# Patient Record
Sex: Male | Born: 1980 | Race: Black or African American | Hispanic: No | Marital: Single | Smoking: Current every day smoker
Health system: Southern US, Community
[De-identification: ages and names within clinical notes are randomized; demographics above are authoritative.]

## PROBLEM LIST (undated history)

## (undated) DIAGNOSIS — J45909 Unspecified asthma, uncomplicated: Secondary | ICD-10-CM

## (undated) DIAGNOSIS — S0181XA Laceration without foreign body of other part of head, initial encounter: Secondary | ICD-10-CM

## (undated) DIAGNOSIS — S42409A Unspecified fracture of lower end of unspecified humerus, initial encounter for closed fracture: Secondary | ICD-10-CM

## (undated) DIAGNOSIS — R1310 Dysphagia, unspecified: Secondary | ICD-10-CM

## (undated) DIAGNOSIS — Z8781 Personal history of (healed) traumatic fracture: Secondary | ICD-10-CM

## (undated) DIAGNOSIS — R198 Other specified symptoms and signs involving the digestive system and abdomen: Secondary | ICD-10-CM

## (undated) HISTORY — PX: FRACTURE SURGERY: SHX138

---

## 2012-02-27 ENCOUNTER — Emergency Department (HOSPITAL_COMMUNITY)
Admission: EM | Admit: 2012-02-27 | Discharge: 2012-02-28 | Disposition: A | Discharge status: Home / Self Care | Payer: No Typology Code available for payment source | Attending: Emergency Medicine | Admitting: Emergency Medicine

## 2012-02-27 DIAGNOSIS — S0280XA Fracture of other specified skull and facial bones, unspecified side, initial encounter for closed fracture: Secondary | ICD-10-CM | POA: Insufficient documentation

## 2012-02-27 DIAGNOSIS — S0181XA Laceration without foreign body of other part of head, initial encounter: Secondary | ICD-10-CM

## 2012-02-27 DIAGNOSIS — S42309A Unspecified fracture of shaft of humerus, unspecified arm, initial encounter for closed fracture: Secondary | ICD-10-CM | POA: Insufficient documentation

## 2012-02-27 DIAGNOSIS — T148XXA Other injury of unspecified body region, initial encounter: Secondary | ICD-10-CM | POA: Insufficient documentation

## 2012-02-27 DIAGNOSIS — S42302A Unspecified fracture of shaft of humerus, left arm, initial encounter for closed fracture: Secondary | ICD-10-CM

## 2012-02-27 DIAGNOSIS — S0292XA Unspecified fracture of facial bones, initial encounter for closed fracture: Secondary | ICD-10-CM

## 2012-02-27 DIAGNOSIS — S0180XA Unspecified open wound of other part of head, initial encounter: Secondary | ICD-10-CM | POA: Insufficient documentation

## 2012-02-27 DIAGNOSIS — Y9389 Activity, other specified: Secondary | ICD-10-CM | POA: Insufficient documentation

## 2012-02-27 DIAGNOSIS — J45909 Unspecified asthma, uncomplicated: Secondary | ICD-10-CM | POA: Insufficient documentation

## 2012-02-27 DIAGNOSIS — S42409A Unspecified fracture of lower end of unspecified humerus, initial encounter for closed fracture: Secondary | ICD-10-CM

## 2012-02-27 DIAGNOSIS — T07XXXA Unspecified multiple injuries, initial encounter: Secondary | ICD-10-CM

## 2012-02-27 DIAGNOSIS — Y92410 Unspecified street and highway as the place of occurrence of the external cause: Secondary | ICD-10-CM | POA: Insufficient documentation

## 2012-02-27 DIAGNOSIS — Z8781 Personal history of (healed) traumatic fracture: Secondary | ICD-10-CM

## 2012-02-27 HISTORY — DX: Laceration without foreign body of other part of head, initial encounter: S01.81XA

## 2012-02-27 HISTORY — DX: Personal history of (healed) traumatic fracture: Z87.81

## 2012-02-27 HISTORY — DX: Unspecified asthma, uncomplicated: J45.909

## 2012-02-27 HISTORY — DX: Unspecified fracture of lower end of unspecified humerus, initial encounter for closed fracture: S42.409A

## 2012-02-28 ENCOUNTER — Emergency Department (HOSPITAL_COMMUNITY): Payer: Self-pay

## 2012-02-28 ENCOUNTER — Encounter (HOSPITAL_COMMUNITY): Payer: Self-pay | Admitting: Emergency Medicine

## 2012-02-28 LAB — POCT I-STAT, CHEM 8
Calcium, Ion: 1.11 mmol/L — ABNORMAL LOW (ref 1.12–1.23)
Creatinine, Ser: 1.3 mg/dL (ref 0.50–1.35)
Glucose, Bld: 130 mg/dL — ABNORMAL HIGH (ref 70–99)
HCT: 43 % (ref 39.0–52.0)
Hemoglobin: 14.6 g/dL (ref 13.0–17.0)
Potassium: 3.8 mEq/L (ref 3.5–5.1)
TCO2: 25 mmol/L (ref 0–100)

## 2012-02-28 LAB — SAMPLE TO BLOOD BANK

## 2012-02-28 LAB — CBC
Hemoglobin: 13.4 g/dL (ref 13.0–17.0)
MCH: 28.2 pg (ref 26.0–34.0)
MCV: 84.2 fL (ref 78.0–100.0)
RBC: 4.76 MIL/uL (ref 4.22–5.81)

## 2012-02-28 LAB — COMPREHENSIVE METABOLIC PANEL
AST: 36 U/L (ref 0–37)
Albumin: 3.9 g/dL (ref 3.5–5.2)
CO2: 23 mEq/L (ref 19–32)
Calcium: 8.9 mg/dL (ref 8.4–10.5)
Chloride: 103 mEq/L (ref 96–112)
Creatinine, Ser: 0.86 mg/dL (ref 0.50–1.35)
GFR calc non Af Amer: >90 mL/min (ref >90)
Glucose, Bld: 131 mg/dL — ABNORMAL HIGH (ref 70–99)
Potassium: 3.9 mEq/L (ref 3.5–5.1)
Sodium: 141 mEq/L (ref 135–145)
Total Protein: 7.5 g/dL (ref 6.0–8.3)

## 2012-02-28 LAB — PROTIME-INR: Prothrombin Time: 13.6 seconds (ref 11.6–15.2)

## 2012-02-28 MED ORDER — OXYCODONE-ACETAMINOPHEN 5-325 MG PO TABS
2.0000 | ORAL_TABLET | Freq: Once | ORAL | Status: AC
  Administered 2012-02-28: 2 via ORAL
  Filled 2012-02-28 (×2): qty 2

## 2012-02-28 MED ORDER — IOHEXOL 300 MG/ML  SOLN
100.0000 mL | Freq: Once | INTRAMUSCULAR | Status: AC | PRN
  Administered 2012-02-28: 100 mL via INTRAVENOUS

## 2012-02-28 MED ORDER — FENTANYL CITRATE 0.05 MG/ML IJ SOLN
50.0000 ug | INTRAMUSCULAR | Status: DC | PRN
  Administered 2012-02-28: 50 ug via INTRAVENOUS

## 2012-02-28 MED ORDER — LIDOCAINE-EPINEPHRINE 2 %-1:100000 IJ SOLN: 20.0000 mL | Freq: Once | INTRAMUSCULAR | Status: DC

## 2012-02-28 MED ORDER — OXYCODONE-ACETAMINOPHEN 5-325 MG PO TABS: 2.0000 | ORAL_TABLET | Freq: Four times a day (QID) | ORAL | Status: DC | PRN

## 2012-02-28 MED ORDER — FENTANYL CITRATE 0.05 MG/ML IJ SOLN
INTRAMUSCULAR | Status: AC
  Filled 2012-02-28: qty 2

## 2012-02-28 MED ORDER — HYDROMORPHONE HCL PF 1 MG/ML IJ SOLN
1.0000 mg | Freq: Once | INTRAMUSCULAR | Status: AC
  Administered 2012-02-28: 1 mg via INTRAVENOUS
  Filled 2012-02-28: qty 1

## 2012-02-28 MED ORDER — LIDOCAINE-EPINEPHRINE (PF) 2 %-1:200000 IJ SOLN
10.0000 mL | Freq: Once | INTRAMUSCULAR | Status: AC
  Administered 2012-02-28: 10 mL via INTRADERMAL
  Filled 2012-02-28: qty 10

## 2012-02-28 MED ORDER — CEPHALEXIN 500 MG PO CAPS: 500.0000 mg | ORAL_CAPSULE | Freq: Four times a day (QID) | ORAL | Status: DC

## 2012-02-28 MED ORDER — ONDANSETRON HCL 4 MG/2ML IJ SOLN
4.0000 mg | Freq: Once | INTRAMUSCULAR | Status: AC
Start: 2012-02-28 — End: 2012-02-28
  Administered 2012-02-28: 4 mg via INTRAVENOUS
  Filled 2012-02-28: qty 2

## 2012-02-28 MED ORDER — CEFAZOLIN SODIUM 1-5 GM-% IV SOLN
1.0000 g | Freq: Once | INTRAVENOUS | Status: AC
  Administered 2012-02-28: 1 g via INTRAVENOUS
  Filled 2012-02-28: qty 50

## 2012-02-28 MED ORDER — TETANUS-DIPHTH-ACELL PERTUSSIS 5-2.5-18.5 LF-MCG/0.5 IM SUSP
INTRAMUSCULAR | Status: AC
  Administered 2012-02-28: 0.5 mL via INTRAMUSCULAR
  Filled 2012-02-28: qty 0.5

## 2012-02-28 MED ORDER — IBUPROFEN 800 MG PO TABS: 800.0000 mg | ORAL_TABLET | Freq: Three times a day (TID) | ORAL | Status: DC

## 2012-02-28 MED ORDER — CYCLOBENZAPRINE HCL 10 MG PO TABS: 10.0000 mg | ORAL_TABLET | Freq: Two times a day (BID) | ORAL | Status: DC | PRN

## 2012-02-28 MED ORDER — LIDOCAINE-EPINEPHRINE 1 %-1:100000 IJ SOLN: 20.0000 mL | Freq: Once | INTRAMUSCULAR | Status: DC

## 2012-02-28 MED ORDER — TETANUS-DIPHTHERIA TOXOIDS TD 5-2 LFU IM INJ: 0.5000 mL | INJECTION | Freq: Once | INTRAMUSCULAR | Status: DC

## 2012-02-28 NOTE — ED Notes (Addendum)
Patient to CT scan and xray at this time.

## 2012-02-28 NOTE — ED Notes (Signed)
Patient returned from CT and xray at this time 

## 2012-02-28 NOTE — Progress Notes (Signed)
Orthopedic Tech Progress Note Patient Details:  Noah [name] [birth year] [id number]  Ortho Devices Type of Ortho Device: Long arm splint   Haskell Flirt 02/28/2012, 6:19 AM

## 2012-02-28 NOTE — ED Provider Notes (Signed)
Medical screening examination/treatment/procedure(s) were conducted as a shared visit with non-physician practitioner(s) and myself.  I personally evaluated the patient during the encounter. I assisted with wound closure.   Sunnie Nielsen, MD 02/28/12 [phone]

## 2012-02-28 NOTE — ED Provider Notes (Signed)
Physical Exam  BP 125/71  Pulse 125  Temp 99.5 F (37.5 C) (Oral)  Resp 19  Ht 5\' 10"  (1.778 m)  Wt 180 lb (81.647 kg)  BMI 25.83 kg/m2  SpO2 99%  Physical Exam Asked to suture  ED Course  Procedures  MDM LACERATION REPAIR Performed by: Arman Filter Authorized by: Arman Filter Consent: Verbal consent obtained. Risks and benefits: risks, benefits and alternatives were discussed Consent given by: patient Patient identity confirmed: provided demographic data Prepped and Draped in normal sterile fashion Wound explored  Laceration Location: lip R   Laceration Length: 3cm  No Foreign Bodies seen or palpated  Anesthesia: local infiltration  Local anesthetic: lidocaine 1% wo epinephrine  Anesthetic total: 4 ml  Irrigation method: syringe Amount of cleaning: standard  Skin closure: prolene  Number of sutures: 6  Technique: simple  Patient tolerance: Patient tolerated the procedure well with no immediate complications.  LACERATION REPAIR Performed by: Arman Filter Authorized by: Arman Filter Consent: Verbal consent obtained. Risks and benefits: risks, benefits and alternatives were discussed Consent given by: patient Patient identity confirmed: provided demographic data Prepped and Draped in normal sterile fashion Wound explored  Laceration Location: lip  Laceration Length: 2cm  No Foreign Bodies seen or palpated  Anesthesia: local infiltration  Local anesthetic: lidocaine 1% wo epinephrine  Anesthetic total: 1 ml  Irrigation method: syringe Amount of cleaning: standard  Skin closure:vicryl  Number of sutures: 2  Technique: simple  Patient tolerance: Patient tolerated the procedure well with no immediate complications. LACERATION REPAIR Performed by: Arman Filter Authorized by: Arman Filter Consent: Verbal consent obtained. Risks and benefits: risks, benefits and alternatives were discussed Consent given by: patient Patient  identity confirmed: provided demographic data Prepped and Draped in normal sterile fashion Wound explored  Laceration Location: lip  Laceration Length: 2cm  No Foreign Bodies seen or palpated  Anesthesia: local infiltration  Local anesthetic: lidocaine 1% *wo epinephrine  Anesthetic total: 1 ml  Irrigation method: syringe Amount of cleaning: standard  Skin closure: deep subq   Number of sutures: 2  Technique: simple  Patient tolerance: Patient tolerated the procedure well with no immediate complications.  LACERATION REPAIR Performed by: Arman Filter Authorized by: Arman Filter Consent: Verbal consent obtained. Risks and benefits: risks, benefits and alternatives were discussed Consent given by: patient Patient identity confirmed: provided demographic data Prepped and Draped in normal sterile fashion Wound explored  Laceration Location: nose  Laceration Length: 1.5cm  No Foreign Bodies seen or palpated  Anesthesia: local infiltration  Local anesthetic: lidocaine 1% woepinephrine  Anesthetic total: 2 ml  Irrigation method: syringe Amount of cleaning: standard  Skin closure:simple  Number of sutures: 7  Technique: simple  Patient tolerance: Patient tolerated the procedure well with no immediate complications. LACERATION REPAIR Performed by: Arman Filter Authorized by: Arman Filter Consent: Verbal consent obtained. Risks and benefits: risks, benefits and alternatives were discussed Consent given by: patient Patient identity confirmed: provided demographic data Prepped and Draped in normal sterile fashion Wound explored  Laceration Location: nose  Laceration Length: 1.5cm  No Foreign Bodies seen or palpated  Anesthesia: local infiltration  Local anesthetic: lidocaine 1%wo* epinephrine  Anesthetic total: ml  Irrigation method: syringe Amount of cleaning: standard  Skin closure: subq  Number of sutures: 4  Technique:  simple  Patient tolerance: Patient tolerated the procedure well with no immediate complications.  LACERATION REPAIR Performed by: Arman Filter Authorized by: Arman Filter Consent: Verbal consent obtained. Risks and benefits: risks, benefits and alternatives  were discussed Consent given by: patient Patient identity confirmed: provided demographic data Prepped and Draped in normal sterile fashion Wound explored  Laceration Location: forhead  Laceration Length: 5cm  No Foreign Bodies seen or palpated  Anesthesia: local infiltration  Local anesthetic: lidocaine 1% w epinephrine  Anesthetic total: 4ml  Irrigation method: syringe Amount of cleaning: standard  Skin closure: 6 prolene  Number of sutures: 10  Technique: simple  Patient tolerance: Patient tolerated the procedure well with no immediate complications.  LACERATION REPAIR Performed by: Arman Filter Authorized by: Arman Filter Consent: Verbal consent obtained. Risks and benefits: risks, benefits and alternatives were discussed Consent given by: patient Patient identity confirmed: provided demographic data Prepped and Draped in normal sterile fashion Wound explored  Laceration Location: forhead  Laceration Length: cm  No Foreign Bodies seen or palpated  Anesthesia: local infiltration  Local anesthetic: lidocaine 1% wepinephrine  Anesthetic total:  ml  Irrigation method: syringe Amount of cleaning: standard  Skin closure: sub q  Number of sutures: 4  Technique: simple  Patient tolerance: Patient tolerated the procedure well with no immediate complications.  LACERATION REPAIR Performed by: Arman Filter Authorized by: Arman Filter Consent: Verbal consent obtained. Risks and benefits: risks, benefits and alternatives were discussed Consent given by: patient Patient identity confirmed: provided demographic data Prepped and Draped in normal sterile fashion Wound explored  Laceration  Location: forhead  Laceration Length: 6cm  No Foreign Bodies seen or palpated  Anesthesia: local infiltration  Local anesthetic:   Anesthetic total: Irrigation method: syringe Amount of cleaning: standard  Skin closure: dermabond  Number of sutures:   Technique: dermabond  Patient tolerance: Patient tolerated the procedure well with no immediate complications.   Arman Filter, NP 02/28/12 [phone]
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
## 2012-02-28 NOTE — Progress Notes (Signed)
Orthopedic Tech Progress Note Patient Details:  Noah [name] 03-10-1980 [id number]  Ortho Devices Type of Ortho Device: Sling immobilizer   Haskell Flirt 02/28/2012, 3:57 AM
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
## 2012-03-17 ENCOUNTER — Other Ambulatory Visit: Payer: Self-pay | Admitting: Orthopedic Surgery

## 2012-03-17 NOTE — Addendum Note (Signed)
Addended by: Jodi Geralds on: 03/17/2012 05:52 PM   Modules accepted: Orders

## 2012-03-18 ENCOUNTER — Other Ambulatory Visit: Payer: Self-pay | Admitting: Orthopedic Surgery

## 2012-03-18 ENCOUNTER — Encounter (HOSPITAL_BASED_OUTPATIENT_CLINIC_OR_DEPARTMENT_OTHER): Payer: Self-pay | Admitting: *Deleted

## 2012-03-20 ENCOUNTER — Encounter (HOSPITAL_BASED_OUTPATIENT_CLINIC_OR_DEPARTMENT_OTHER): Payer: Self-pay | Admitting: *Deleted

## 2012-03-20 ENCOUNTER — Ambulatory Visit (HOSPITAL_BASED_OUTPATIENT_CLINIC_OR_DEPARTMENT_OTHER)
Admission: RE | Admit: 2012-03-20 | Discharge: 2012-03-21 | Disposition: A | Discharge status: Home / Self Care | Payer: BC Managed Care – PPO | Source: Ambulatory Visit | Attending: Orthopedic Surgery | Admitting: Orthopedic Surgery

## 2012-03-20 ENCOUNTER — Encounter (HOSPITAL_BASED_OUTPATIENT_CLINIC_OR_DEPARTMENT_OTHER): Payer: Self-pay | Admitting: Anesthesiology

## 2012-03-20 ENCOUNTER — Ambulatory Visit (HOSPITAL_BASED_OUTPATIENT_CLINIC_OR_DEPARTMENT_OTHER): Payer: BC Managed Care – PPO | Admitting: Anesthesiology

## 2012-03-20 ENCOUNTER — Ambulatory Visit (HOSPITAL_COMMUNITY): Payer: BC Managed Care – PPO

## 2012-03-20 ENCOUNTER — Encounter (HOSPITAL_BASED_OUTPATIENT_CLINIC_OR_DEPARTMENT_OTHER)
Admission: RE | Disposition: A | Discharge status: Home / Self Care | Payer: Self-pay | Source: Ambulatory Visit | Attending: Orthopedic Surgery

## 2012-03-20 DIAGNOSIS — S42309A Unspecified fracture of shaft of humerus, unspecified arm, initial encounter for closed fracture: Secondary | ICD-10-CM | POA: Insufficient documentation

## 2012-03-20 DIAGNOSIS — S42409A Unspecified fracture of lower end of unspecified humerus, initial encounter for closed fracture: Secondary | ICD-10-CM

## 2012-03-20 HISTORY — DX: Dysphagia, unspecified: R13.10

## 2012-03-20 HISTORY — DX: Personal history of (healed) traumatic fracture: Z87.81

## 2012-03-20 HISTORY — DX: Laceration without foreign body of other part of head, initial encounter: S01.81XA

## 2012-03-20 HISTORY — DX: Other specified symptoms and signs involving the digestive system and abdomen: R19.8

## 2012-03-20 HISTORY — DX: Unspecified fracture of lower end of unspecified humerus, initial encounter for closed fracture: S42.409A

## 2012-03-20 HISTORY — PX: ORIF HUMERUS FRACTURE: SHX2126

## 2012-03-20 SURGERY — OPEN REDUCTION INTERNAL FIXATION (ORIF) DISTAL HUMERUS FRACTURE
Anesthesia: Choice | Site: Arm Upper | Laterality: Left | Wound class: Clean Contaminated

## 2012-03-20 MED ORDER — METOCLOPRAMIDE HCL 5 MG/ML IJ SOLN: 5.0000 mg | Freq: Three times a day (TID) | INTRAMUSCULAR | Status: DC | PRN

## 2012-03-20 MED ORDER — ONDANSETRON HCL 4 MG/2ML IJ SOLN: 4.0000 mg | Freq: Once | INTRAMUSCULAR | Status: DC | PRN

## 2012-03-20 MED ORDER — FENTANYL CITRATE 0.05 MG/ML IJ SOLN
INTRAMUSCULAR | Status: DC | PRN
  Administered 2012-03-20 (×2): 25 ug via INTRAVENOUS

## 2012-03-20 MED ORDER — KCL IN DEXTROSE-NACL 20-5-0.45 MEQ/L-%-% IV SOLN
INTRAVENOUS | Status: DC
  Administered 2012-03-20: 13:00:00 via INTRAVENOUS

## 2012-03-20 MED ORDER — CEFAZOLIN SODIUM-DEXTROSE 2-3 GM-% IV SOLR
2.0000 g | INTRAVENOUS | Status: AC
  Administered 2012-03-20: 2 g via INTRAVENOUS

## 2012-03-20 MED ORDER — SUCCINYLCHOLINE CHLORIDE 20 MG/ML IJ SOLN
INTRAMUSCULAR | Status: DC | PRN
  Administered 2012-03-20: 100 mg via INTRAVENOUS

## 2012-03-20 MED ORDER — HYDROMORPHONE HCL PF 1 MG/ML IJ SOLN: 0.2500 mg | INTRAMUSCULAR | Status: DC | PRN

## 2012-03-20 MED ORDER — DIPHENHYDRAMINE HCL 12.5 MG/5ML PO ELIX: 12.5000 mg | ORAL_SOLUTION | ORAL | Status: DC | PRN

## 2012-03-20 MED ORDER — DEXAMETHASONE SODIUM PHOSPHATE 10 MG/ML IJ SOLN
INTRAMUSCULAR | Status: DC | PRN
  Administered 2012-03-20: 5 mg

## 2012-03-20 MED ORDER — DOCUSATE SODIUM 100 MG PO CAPS: 100.0000 mg | ORAL_CAPSULE | Freq: Two times a day (BID) | ORAL | Status: DC

## 2012-03-20 MED ORDER — FENTANYL CITRATE 0.05 MG/ML IJ SOLN
50.0000 ug | INTRAMUSCULAR | Status: DC | PRN
  Administered 2012-03-20: 100 ug via INTRAVENOUS

## 2012-03-20 MED ORDER — LACTATED RINGERS IV SOLN
INTRAVENOUS | Status: DC
  Administered 2012-03-20 (×3): via INTRAVENOUS

## 2012-03-20 MED ORDER — OXYCODONE-ACETAMINOPHEN 5-325 MG PO TABS
1.0000 | ORAL_TABLET | ORAL | Status: DC | PRN
  Administered 2012-03-20: 1 via ORAL
  Administered 2012-03-20: 2 via ORAL
  Administered 2012-03-20: 1 via ORAL
  Administered 2012-03-21 (×3): 2 via ORAL

## 2012-03-20 MED ORDER — METOCLOPRAMIDE HCL 5 MG PO TABS: 5.0000 mg | ORAL_TABLET | Freq: Three times a day (TID) | ORAL | Status: DC | PRN

## 2012-03-20 MED ORDER — DEXAMETHASONE SODIUM PHOSPHATE 4 MG/ML IJ SOLN
INTRAMUSCULAR | Status: DC | PRN
  Administered 2012-03-20: 10 mg via INTRAVENOUS

## 2012-03-20 MED ORDER — ONDANSETRON HCL 4 MG/2ML IJ SOLN
4.0000 mg | Freq: Four times a day (QID) | INTRAMUSCULAR | Status: DC | PRN
Start: 2012-03-20 — End: 2012-03-21

## 2012-03-20 MED ORDER — ALBUTEROL SULFATE HFA 108 (90 BASE) MCG/ACT IN AERS: 2.0000 | INHALATION_SPRAY | Freq: Four times a day (QID) | RESPIRATORY_TRACT | Status: DC | PRN

## 2012-03-20 MED ORDER — OXYCODONE-ACETAMINOPHEN 5-325 MG PO TABS: 2.0000 | ORAL_TABLET | ORAL | Status: DC | PRN

## 2012-03-20 MED ORDER — POVIDONE-IODINE 7.5 % EX SOLN: Freq: Once | CUTANEOUS | Status: DC

## 2012-03-20 MED ORDER — ASPIRIN EC 325 MG PO TBEC
325.0000 mg | DELAYED_RELEASE_TABLET | Freq: Two times a day (BID) | ORAL | Status: DC
  Administered 2012-03-20 (×2): 325 mg via ORAL

## 2012-03-20 MED ORDER — MORPHINE SULFATE 2 MG/ML IJ SOLN: 1.0000 mg | INTRAMUSCULAR | Status: DC | PRN

## 2012-03-20 MED ORDER — PROPOFOL 10 MG/ML IV BOLUS
INTRAVENOUS | Status: DC | PRN
  Administered 2012-03-20: 250 mg via INTRAVENOUS

## 2012-03-20 MED ORDER — ACETAMINOPHEN 650 MG RE SUPP: 650.0000 mg | Freq: Four times a day (QID) | RECTAL | Status: DC | PRN

## 2012-03-20 MED ORDER — OXYCODONE HCL 5 MG PO TABS: 5.0000 mg | ORAL_TABLET | Freq: Once | ORAL | Status: DC | PRN

## 2012-03-20 MED ORDER — POLYETHYLENE GLYCOL 3350 17 G PO PACK: 17.0000 g | PACK | Freq: Every day | ORAL | Status: DC | PRN

## 2012-03-20 MED ORDER — ALUM & MAG HYDROXIDE-SIMETH 200-200-20 MG/5ML PO SUSP: 30.0000 mL | ORAL | Status: DC | PRN

## 2012-03-20 MED ORDER — ZOLPIDEM TARTRATE 5 MG PO TABS: 5.0000 mg | ORAL_TABLET | Freq: Every evening | ORAL | Status: DC | PRN

## 2012-03-20 MED ORDER — LIDOCAINE HCL (CARDIAC) 20 MG/ML IV SOLN
INTRAVENOUS | Status: DC | PRN
  Administered 2012-03-20: 75 mg via INTRAVENOUS

## 2012-03-20 MED ORDER — HYDROCODONE-ACETAMINOPHEN 5-325 MG PO TABS: 1.0000 | ORAL_TABLET | ORAL | Status: DC | PRN

## 2012-03-20 MED ORDER — PHENOL 1.4 % MT LIQD: 1.0000 | OROMUCOSAL | Status: DC | PRN

## 2012-03-20 MED ORDER — ACETAMINOPHEN 325 MG PO TABS: 650.0000 mg | ORAL_TABLET | Freq: Four times a day (QID) | ORAL | Status: DC | PRN

## 2012-03-20 MED ORDER — BUPIVACAINE-EPINEPHRINE PF 0.5-1:200000 % IJ SOLN
INTRAMUSCULAR | Status: DC | PRN
  Administered 2012-03-20: 27 mL

## 2012-03-20 MED ORDER — CEFAZOLIN SODIUM 1-5 GM-% IV SOLN
1.0000 g | Freq: Four times a day (QID) | INTRAVENOUS | Status: AC
  Administered 2012-03-20 – 2012-03-21 (×3): 1 g via INTRAVENOUS

## 2012-03-20 MED ORDER — FENTANYL CITRATE 0.05 MG/ML IJ SOLN: 50.0000 ug | INTRAMUSCULAR | Status: DC | PRN

## 2012-03-20 MED ORDER — ONDANSETRON HCL 4 MG/2ML IJ SOLN
INTRAMUSCULAR | Status: DC | PRN
  Administered 2012-03-20: 4 mg via INTRAVENOUS

## 2012-03-20 MED ORDER — ONDANSETRON HCL 4 MG PO TABS: 4.0000 mg | ORAL_TABLET | Freq: Four times a day (QID) | ORAL | Status: DC | PRN

## 2012-03-20 MED ORDER — BISACODYL 10 MG RE SUPP: 10.0000 mg | Freq: Every day | RECTAL | Status: DC | PRN

## 2012-03-20 MED ORDER — OXYCODONE HCL 5 MG/5ML PO SOLN: 5.0000 mg | Freq: Once | ORAL | Status: DC | PRN

## 2012-03-20 MED ORDER — MIDAZOLAM HCL 2 MG/2ML IJ SOLN: 1.0000 mg | INTRAMUSCULAR | Status: DC | PRN

## 2012-03-20 MED ORDER — MIDAZOLAM HCL 2 MG/2ML IJ SOLN
1.0000 mg | INTRAMUSCULAR | Status: DC | PRN
  Administered 2012-03-20: 2 mg via INTRAVENOUS

## 2012-03-20 MED ORDER — MENTHOL 3 MG MT LOZG: 1.0000 | LOZENGE | OROMUCOSAL | Status: DC | PRN

## 2012-03-20 SURGICAL SUPPLY — 88 items
BANDAGE ELASTIC 3 VELCRO ST LF (GAUZE/BANDAGES/DRESSINGS) IMPLANT
BANDAGE ELASTIC 4 VELCRO ST LF (GAUZE/BANDAGES/DRESSINGS) ×2 IMPLANT
BENZOIN TINCTURE PRP APPL 2/3 (GAUZE/BANDAGES/DRESSINGS) ×2 IMPLANT
BIT DRILL 2.5X2.75 QC CALB (BIT) ×2 IMPLANT
BIT DRILL 3.5X5.5 QC CALB (BIT) ×2 IMPLANT
BIT DRILL CALIBRATED 2.7 (BIT) ×2 IMPLANT
BLADE MINI RND TIP GREEN BEAV (BLADE) IMPLANT
BLADE SURG 15 STRL LF DISP TIS (BLADE) ×2 IMPLANT
BLADE SURG 15 STRL SS (BLADE) ×2
BNDG COHESIVE 4X5 TAN STRL (GAUZE/BANDAGES/DRESSINGS) ×4 IMPLANT
BNDG ESMARK 4X9 LF (GAUZE/BANDAGES/DRESSINGS) ×2 IMPLANT
CANISTER SUCTION 1200CC (MISCELLANEOUS) ×2 IMPLANT
COVER TABLE BACK 60X90 (DRAPES) ×2 IMPLANT
DECANTER SPIKE VIAL GLASS SM (MISCELLANEOUS) IMPLANT
DRAPE C-ARM 42X72 X-RAY (DRAPES) ×2 IMPLANT
DRAPE EXTREMITY T 121X128X90 (DRAPE) IMPLANT
DRAPE INCISE IOBAN 66X45 STRL (DRAPES) ×2 IMPLANT
DRAPE U-SHAPE 47X51 STRL (DRAPES) ×2 IMPLANT
DRAPE U-SHAPE 76X120 STRL (DRAPES) ×4 IMPLANT
DURAPREP 26ML APPLICATOR (WOUND CARE) ×2 IMPLANT
ELECT REM PT RETURN 9FT ADLT (ELECTROSURGICAL) ×2
ELECTRODE REM PT RTRN 9FT ADLT (ELECTROSURGICAL) ×1 IMPLANT
GLOVE BIO SURGEON STRL SZ7 (GLOVE) ×4 IMPLANT
GLOVE BIO SURGEON STRL SZ7.5 (GLOVE) ×2 IMPLANT
GLOVE BIOGEL M STRL SZ7.5 (GLOVE) ×2 IMPLANT
GLOVE BIOGEL PI IND STRL 7.0 (GLOVE) ×2 IMPLANT
GLOVE BIOGEL PI IND STRL 8 (GLOVE) ×2 IMPLANT
GLOVE BIOGEL PI INDICATOR 7.0 (GLOVE) ×2
GLOVE BIOGEL PI INDICATOR 8 (GLOVE) ×2
GOWN PREVENTION PLUS XLARGE (GOWN DISPOSABLE) ×2 IMPLANT
K-WIRE FIXATION 2.0X6 (WIRE) ×4
KWIRE FIXATION 2.0X6 (WIRE) ×2 IMPLANT
NEEDLE HYPO 25X1 1.5 SAFETY (NEEDLE) IMPLANT
NS IRRIG 1000ML POUR BTL (IV SOLUTION) ×2 IMPLANT
PACK BASIN DAY SURGERY FS (CUSTOM PROCEDURE TRAY) ×2 IMPLANT
PAD CAST 3X4 CTTN HI CHSV (CAST SUPPLIES) IMPLANT
PAD CAST 4YDX4 CTTN HI CHSV (CAST SUPPLIES) ×1 IMPLANT
PADDING CAST ABS 4INX4YD NS (CAST SUPPLIES) ×1
PADDING CAST ABS COTTON 4X4 ST (CAST SUPPLIES) ×1 IMPLANT
PADDING CAST COTTON 3X4 STRL (CAST SUPPLIES)
PADDING CAST COTTON 4X4 STRL (CAST SUPPLIES) ×1
PENCIL BUTTON HOLSTER BLD 10FT (ELECTRODE) ×2 IMPLANT
PLATE DISTAL HUMERUS 25H LT (Plate) ×2 IMPLANT
SCREW CORT 3.5X26 (Screw) ×3 IMPLANT
SCREW CORT T15 24X3.5XST LCK (Screw) ×2 IMPLANT
SCREW CORT T15 26X3.5XST LCK (Screw) ×3 IMPLANT
SCREW CORT T15 28X3.5XST LCK (Screw) ×1 IMPLANT
SCREW CORT T15 30X3.5XST LCK (Screw) ×2 IMPLANT
SCREW CORTICAL 3.5MM  28MM (Screw) ×1 IMPLANT
SCREW CORTICAL 3.5MM 28MM (Screw) ×1 IMPLANT
SCREW CORTICAL 3.5X24MM (Screw) ×2 IMPLANT
SCREW CORTICAL 3.5X28MM (Screw) ×1 IMPLANT
SCREW CORTICAL 3.5X30MM (Screw) ×2 IMPLANT
SCREW LOCK 3.5X20 DIST TIB (Screw) ×2 IMPLANT
SCREW LOCK CORT STAR 3.5X16 (Screw) ×2 IMPLANT
SCREW LOW PROFILE 22MMX3.5MM (Screw) ×4 IMPLANT
SHEET MEDIUM DRAPE 40X70 STRL (DRAPES) ×2 IMPLANT
SLEEVE SCD COMPRESS KNEE MED (MISCELLANEOUS) ×2 IMPLANT
SPLINT FAST PLASTER 5X30 (CAST SUPPLIES)
SPLINT PLASTER CAST FAST 5X30 (CAST SUPPLIES) IMPLANT
SPLINT PLASTER CAST XFAST 4X15 (CAST SUPPLIES) IMPLANT
SPLINT PLASTER XTRA FAST SET 4 (CAST SUPPLIES)
SPONGE GAUZE 4X4 12PLY (GAUZE/BANDAGES/DRESSINGS) ×2 IMPLANT
SPONGE LAP 18X18 X RAY DECT (DISPOSABLE) ×2 IMPLANT
SPONGE LAP 4X18 X RAY DECT (DISPOSABLE) ×2 IMPLANT
STAPLER VISISTAT 35W (STAPLE) IMPLANT
STOCKINETTE IMPERVIOUS LG (DRAPES) ×2 IMPLANT
STRIP CLOSURE SKIN 1/2X4 (GAUZE/BANDAGES/DRESSINGS) ×2 IMPLANT
SUCTION FRAZIER TIP 10 FR DISP (SUCTIONS) ×2 IMPLANT
SUT ETHIBOND 0 MO6 C/R (SUTURE) IMPLANT
SUT ETHILON 3 0 PS 1 (SUTURE) IMPLANT
SUT ETHILON 4 0 PS 2 18 (SUTURE) IMPLANT
SUT MNCRL AB 4-0 PS2 18 (SUTURE) IMPLANT
SUT VIC AB 0 CT1 27 (SUTURE) ×1
SUT VIC AB 0 CT1 27XBRD ANBCTR (SUTURE) ×1 IMPLANT
SUT VIC AB 2-0 SH 18 (SUTURE) ×2 IMPLANT
SUT VIC AB 3-0 SH 27 (SUTURE)
SUT VIC AB 3-0 SH 27X BRD (SUTURE) IMPLANT
SUT VICRYL 3-0 CR8 SH (SUTURE) ×2 IMPLANT
SYR BULB 3OZ (MISCELLANEOUS) ×2 IMPLANT
SYR CONTROL 10ML LL (SYRINGE) IMPLANT
TOWEL OR 17X24 6PK STRL BLUE (TOWEL DISPOSABLE) ×2 IMPLANT
TOWEL OR NON WOVEN STRL DISP B (DISPOSABLE) IMPLANT
TUBE CONNECTING 20X1/4 (TUBING) ×4 IMPLANT
UNDERPAD 30X30 INCONTINENT (UNDERPADS AND DIAPERS) ×2 IMPLANT
WASHER 3.5MM (Orthopedic Implant) ×4 IMPLANT
WATER STERILE IRR 1000ML POUR (IV SOLUTION) ×2 IMPLANT
YANKAUER SUCT BULB TIP NO VENT (SUCTIONS) ×2 IMPLANT

## 2012-03-20 NOTE — Progress Notes (Signed)
  Assisted Dr. Crews with left, ultrasound guided, supraclavicular block. Side rails up, monitors on throughout procedure. See vital signs in flow sheet. Tolerated Procedure well. 
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
## 2012-03-20 NOTE — Anesthesia Preprocedure Evaluation (Signed)
Anesthesia Evaluation  Patient identified by MRN, date of birth, ID band Patient awake    Airway Mallampati: I TM Distance: >3 FB Neck ROM: Full    Dental  (+) Teeth Intact and Dental Advisory Given   Pulmonary asthma ,  breath sounds clear to auscultation        Cardiovascular Rhythm:Regular Rate:Normal     Neuro/Psych    GI/Hepatic   Endo/Other    Renal/GU      Musculoskeletal   Abdominal   Peds  Hematology   Anesthesia Other Findings   Reproductive/Obstetrics                           Anesthesia Physical Anesthesia Plan  ASA: II  Anesthesia Plan: General   Post-op Pain Management:    Induction: Intravenous  Airway Management Planned: Oral ETT  Additional Equipment:   Intra-op Plan:   Post-operative Plan: Extubation in OR  Informed Consent: I have reviewed the patients History and Physical, chart, labs and discussed the procedure including the risks, benefits and alternatives for the proposed anesthesia with the patient or authorized representative who has indicated his/her understanding and acceptance.   Dental advisory given  Plan Discussed with: CRNA, Anesthesiologist and Surgeon  Anesthesia Plan Comments:         Anesthesia Quick Evaluation

## 2012-03-20 NOTE — Anesthesia Postprocedure Evaluation (Signed)
  Anesthesia Post-op Note  Patient: Noah [name]  Procedure(s) Performed: Procedure(s) (LRB) with comments: OPEN REDUCTION INTERNAL FIXATION (ORIF) DISTAL HUMERUS FRACTURE (Left)  Patient Location: PACU  Anesthesia Type:GA combined with regional for post-op pain  Level of Consciousness: awake, alert  and oriented  Airway and Oxygen Therapy: Patient Spontanous Breathing and Patient connected to face mask oxygen  Post-op Pain: none  Post-op Assessment: Post-op Vital signs reviewed  Post-op Vital Signs: Reviewed  Complications: No apparent anesthesia complications

## 2012-03-20 NOTE — Transfer of Care (Signed)
Immediate Anesthesia Transfer of Care Note  Patient: Noah [name]  Procedure(s) Performed: Procedure(s) (LRB) with comments: OPEN REDUCTION INTERNAL FIXATION (ORIF) DISTAL HUMERUS FRACTURE (Left)  Patient Location: PACU  Anesthesia Type:GA combined with regional for post-op pain  Level of Consciousness: awake and patient cooperative  Airway & Oxygen Therapy: Patient Spontanous Breathing and Patient connected to face mask oxygen  Post-op Assessment: Report given to PACU RN and Post -op Vital signs reviewed and stable  Post vital signs: Reviewed and stable  Complications: No apparent anesthesia complications

## 2012-03-20 NOTE — Anesthesia Procedure Notes (Addendum)
Procedure Name: Intubation Date/Time: 03/20/2012 8:26 AM Performed by: Gar Gibbon Pre-anesthesia Checklist: Patient identified, Emergency Drugs available, Suction available and Patient being monitored Patient Re-evaluated:Patient Re-evaluated prior to inductionOxygen Delivery Method: Circle System Utilized Preoxygenation: Pre-oxygenation with 100% oxygen Intubation Type: IV induction Ventilation: Mask ventilation without difficulty Laryngoscope Size: Miller and 2 Grade View: Grade III Tube type: Oral Tube size: 8.0 mm Number of attempts: 1 Airway Equipment and Method: stylet and oral airway Placement Confirmation: ETT inserted through vocal cords under direct vision,  positive ETCO2 and breath sounds checked- equal and bilateral Secured at: 24 cm Tube secured with: Tape Dental Injury: Teeth and Oropharynx as per pre-operative assessment     Anesthesia Regional Block:   Narrative:    Anesthesia Regional Block:  Supraclavicular block  Pre-Anesthetic Checklist: ,, timeout performed, Correct Patient, Correct Site, Correct Laterality, Correct Procedure, Correct Position, site marked, Risks and benefits discussed,  Surgical consent,  Pre-op evaluation,  At surgeon's request and post-op pain management  Laterality: Left and Upper  Prep: chloraprep       Needles:  Injection technique: Single-shot  Needle Type: Echogenic Needle     Needle Length: 5cm 5 cm Needle Gauge: 21 and 21 G    Additional Needles:  Procedures: ultrasound guided (picture in chart) Supraclavicular block Narrative:  Start time: 03/20/2012 8:07 AM End time: 03/20/2012 8:12 AM Injection made incrementally with aspirations every 5 mL.  Performed by: Personally  Anesthesiologist: Sheldon Silvan  Supraclavicular block

## 2012-03-25 ENCOUNTER — Encounter (HOSPITAL_BASED_OUTPATIENT_CLINIC_OR_DEPARTMENT_OTHER): Payer: Self-pay | Admitting: Orthopedic Surgery

## 2016-05-22 ENCOUNTER — Emergency Department (HOSPITAL_COMMUNITY): Payer: Medicaid Other

## 2016-05-22 ENCOUNTER — Inpatient Hospital Stay (HOSPITAL_COMMUNITY)
Admission: EM | Admit: 2016-05-22 | Discharge: 2016-05-26 | DRG: 100 | Disposition: A | Discharge status: Home / Self Care | Payer: Medicaid Other | Attending: Internal Medicine | Admitting: Internal Medicine

## 2016-05-22 ENCOUNTER — Encounter (HOSPITAL_COMMUNITY): Payer: Self-pay | Admitting: Emergency Medicine

## 2016-05-22 DIAGNOSIS — F17210 Nicotine dependence, cigarettes, uncomplicated: Secondary | ICD-10-CM | POA: Diagnosis present

## 2016-05-22 DIAGNOSIS — G4489 Other headache syndrome: Secondary | ICD-10-CM | POA: Diagnosis not present

## 2016-05-22 DIAGNOSIS — G9340 Encephalopathy, unspecified: Secondary | ICD-10-CM | POA: Diagnosis present

## 2016-05-22 DIAGNOSIS — T424X6A Underdosing of benzodiazepines, initial encounter: Secondary | ICD-10-CM | POA: Diagnosis present

## 2016-05-22 DIAGNOSIS — R519 Headache, unspecified: Secondary | ICD-10-CM

## 2016-05-22 DIAGNOSIS — A419 Sepsis, unspecified organism: Secondary | ICD-10-CM

## 2016-05-22 DIAGNOSIS — F13231 Sedative, hypnotic or anxiolytic dependence with withdrawal delirium: Secondary | ICD-10-CM | POA: Diagnosis present

## 2016-05-22 DIAGNOSIS — R569 Unspecified convulsions: Principal | ICD-10-CM | POA: Diagnosis present

## 2016-05-22 DIAGNOSIS — R509 Fever, unspecified: Secondary | ICD-10-CM

## 2016-05-22 DIAGNOSIS — F1020 Alcohol dependence, uncomplicated: Secondary | ICD-10-CM | POA: Diagnosis present

## 2016-05-22 DIAGNOSIS — B1920 Unspecified viral hepatitis C without hepatic coma: Secondary | ICD-10-CM | POA: Diagnosis present

## 2016-05-22 DIAGNOSIS — R7401 Elevation of levels of liver transaminase levels: Secondary | ICD-10-CM

## 2016-05-22 DIAGNOSIS — F1910 Other psychoactive substance abuse, uncomplicated: Secondary | ICD-10-CM

## 2016-05-22 DIAGNOSIS — Z91128 Patient's intentional underdosing of medication regimen for other reason: Secondary | ICD-10-CM

## 2016-05-22 DIAGNOSIS — G039 Meningitis, unspecified: Secondary | ICD-10-CM

## 2016-05-22 DIAGNOSIS — F419 Anxiety disorder, unspecified: Secondary | ICD-10-CM | POA: Diagnosis present

## 2016-05-22 DIAGNOSIS — G0490 Encephalitis and encephalomyelitis, unspecified: Secondary | ICD-10-CM

## 2016-05-22 DIAGNOSIS — J45909 Unspecified asthma, uncomplicated: Secondary | ICD-10-CM | POA: Diagnosis present

## 2016-05-22 DIAGNOSIS — R740 Nonspecific elevation of levels of transaminase and lactic acid dehydrogenase [LDH]: Secondary | ICD-10-CM

## 2016-05-22 DIAGNOSIS — R51 Headache: Secondary | ICD-10-CM

## 2016-05-22 LAB — CBC WITH DIFFERENTIAL/PLATELET
Basophils Absolute: 0 10*3/uL (ref 0.0–0.1)
Basophils Relative: 0 %
EOS ABS: 0 10*3/uL (ref 0.0–0.7)
EOS PCT: 0 %
HCT: 38.6 % — ABNORMAL LOW (ref 39.0–52.0)
Hemoglobin: 12.9 g/dL — ABNORMAL LOW (ref 13.0–17.0)
LYMPHS ABS: 1 10*3/uL (ref 0.7–4.0)
Lymphocytes Relative: 6 %
MCH: 29.1 pg (ref 26.0–34.0)
MCHC: 33.4 g/dL (ref 30.0–36.0)
MCV: 86.9 fL (ref 78.0–100.0)
MONO ABS: 1.1 10*3/uL — AB (ref 0.1–1.0)
MONOS PCT: 6 %
Neutro Abs: 16.2 10*3/uL — ABNORMAL HIGH (ref 1.7–7.7)
Neutrophils Relative %: 88 %
Platelets: 267 10*3/uL (ref 150–400)
RBC: 4.44 MIL/uL (ref 4.22–5.81)
RDW: 14.3 % (ref 11.5–15.5)
WBC: 18.3 10*3/uL — ABNORMAL HIGH (ref 4.0–10.5)

## 2016-05-22 LAB — I-STAT CG4 LACTIC ACID, ED
Lactic Acid, Venous: 3.16 mmol/L (ref 0.5–1.9)
Lactic Acid, Venous: 1.68 mmol/L (ref 0.5–1.9)

## 2016-05-22 LAB — URINALYSIS, ROUTINE W REFLEX MICROSCOPIC
BACTERIA UA: NONE SEEN
Bilirubin Urine: NEGATIVE
GLUCOSE, UA: NEGATIVE mg/dL
KETONES UR: 5 mg/dL — AB
LEUKOCYTES UA: NEGATIVE
NITRITE: NEGATIVE
PH: 5 (ref 5.0–8.0)
PROTEIN: 30 mg/dL — AB
Specific Gravity, Urine: 1.015 (ref 1.005–1.030)

## 2016-05-22 LAB — COMPREHENSIVE METABOLIC PANEL
ALT: 62 U/L (ref 17–63)
AST: 75 U/L — AB (ref 15–41)
Albumin: 4.5 g/dL (ref 3.5–5.0)
Alkaline Phosphatase: 68 U/L (ref 38–126)
Anion gap: 14 (ref 5–15)
BUN: 9 mg/dL (ref 6–20)
CALCIUM: 9.8 mg/dL (ref 8.9–10.3)
CO2: 21 mmol/L — AB (ref 22–32)
CREATININE: 1 mg/dL (ref 0.61–1.24)
Chloride: 102 mmol/L (ref 101–111)
GFR calc non Af Amer: >60 mL/min (ref >60)
Glucose, Bld: 98 mg/dL (ref 65–99)
Potassium: 4.3 mmol/L (ref 3.5–5.1)
SODIUM: 137 mmol/L (ref 135–145)
Total Bilirubin: 0.6 mg/dL (ref 0.3–1.2)
Total Protein: 8.7 g/dL — ABNORMAL HIGH (ref 6.5–8.1)

## 2016-05-22 LAB — I-STAT TROPONIN, ED
Troponin i, poc: 0 ng/mL (ref 0.00–0.08)
Troponin i, poc: 0.01 ng/mL (ref 0.00–0.08)

## 2016-05-22 LAB — PROTIME-INR
INR: 1.04
Prothrombin Time: 13.6 seconds (ref 11.4–15.2)

## 2016-05-22 LAB — LIPASE, BLOOD: Lipase: <10 U/L — ABNORMAL LOW (ref 11–51)

## 2016-05-22 MED ORDER — SODIUM CHLORIDE 0.9 % IV BOLUS (SEPSIS)
1000.0000 mL | Freq: Once | INTRAVENOUS | Status: AC
  Administered 2016-05-22: 1000 mL via INTRAVENOUS

## 2016-05-22 MED ORDER — VANCOMYCIN HCL IN DEXTROSE 1-5 GM/200ML-% IV SOLN: 1000.0000 mg | Freq: Once | INTRAVENOUS | Status: DC

## 2016-05-22 MED ORDER — SODIUM CHLORIDE 0.9 % IV BOLUS (SEPSIS)
500.0000 mL | Freq: Once | INTRAVENOUS | Status: AC
Start: 2016-05-22 — End: 2016-05-22
  Administered 2016-05-22: 500 mL via INTRAVENOUS

## 2016-05-22 MED ORDER — LIDOCAINE HCL (PF) 1 % IJ SOLN
INTRAMUSCULAR | Status: AC
  Administered 2016-05-22: 20:00:00 via EPIDURAL
  Filled 2016-05-22: qty 5

## 2016-05-22 MED ORDER — LORAZEPAM 2 MG/ML IJ SOLN
INTRAMUSCULAR | Status: AC
  Administered 2016-05-22: 2 mg via INTRAVENOUS
  Filled 2016-05-22: qty 1

## 2016-05-22 MED ORDER — LORAZEPAM 2 MG/ML IJ SOLN
2.0000 mg | Freq: Once | INTRAMUSCULAR | Status: AC
  Administered 2016-05-22: 2 mg via INTRAVENOUS

## 2016-05-22 MED ORDER — ACYCLOVIR SODIUM 50 MG/ML IV SOLN
10.0000 mg/kg | Freq: Three times a day (TID) | INTRAVENOUS | Status: DC
  Administered 2016-05-22 – 2016-05-23 (×2): 775 mg via INTRAVENOUS
  Filled 2016-05-22 (×3): qty 15.5

## 2016-05-22 MED ORDER — VANCOMYCIN HCL IN DEXTROSE 1-5 GM/200ML-% IV SOLN
1000.0000 mg | Freq: Three times a day (TID) | INTRAVENOUS | Status: DC
  Administered 2016-05-23 – 2016-05-25 (×7): 1000 mg via INTRAVENOUS
  Filled 2016-05-22 (×8): qty 200

## 2016-05-22 MED ORDER — LEVETIRACETAM 500 MG/5ML IV SOLN
1500.0000 mg | Freq: Once | INTRAVENOUS | Status: AC
  Administered 2016-05-22: 1500 mg via INTRAVENOUS
  Filled 2016-05-22: qty 15

## 2016-05-22 MED ORDER — DEXAMETHASONE SODIUM PHOSPHATE 10 MG/ML IJ SOLN
10.0000 mg | Freq: Once | INTRAMUSCULAR | Status: AC
  Administered 2016-05-22: 10 mg via INTRAVENOUS
  Filled 2016-05-22: qty 1

## 2016-05-22 MED ORDER — ACETAMINOPHEN 650 MG RE SUPP
650.0000 mg | Freq: Once | RECTAL | Status: AC
  Administered 2016-05-22: 650 mg via RECTAL
  Filled 2016-05-22: qty 1

## 2016-05-22 MED ORDER — LORAZEPAM 2 MG/ML IJ SOLN
2.0000 mg | Freq: Once | INTRAMUSCULAR | Status: AC
  Administered 2016-05-22: 2 mg via INTRAVENOUS
  Filled 2016-05-22: qty 1

## 2016-05-22 MED ORDER — VANCOMYCIN HCL 10 G IV SOLR
1250.0000 mg | Freq: Once | INTRAVENOUS | Status: AC
  Administered 2016-05-22: 1250 mg via INTRAVENOUS
  Filled 2016-05-22: qty 1250

## 2016-05-22 MED ORDER — LORAZEPAM 2 MG/ML IJ SOLN
INTRAMUSCULAR | Status: AC
  Filled 2016-05-22: qty 1

## 2016-05-22 MED ORDER — LIDOCAINE HCL (PF) 1 % IJ SOLN
INTRAMUSCULAR | Status: AC
  Filled 2016-05-22: qty 30

## 2016-05-22 MED ORDER — ACETAMINOPHEN 325 MG PO TABS: 650.0000 mg | ORAL_TABLET | Freq: Once | ORAL | Status: DC

## 2016-05-22 MED ORDER — DEXTROSE 5 % IV SOLN
2.0000 g | Freq: Once | INTRAVENOUS | Status: AC
  Administered 2016-05-22: 2 g via INTRAVENOUS
  Filled 2016-05-22: qty 2

## 2016-05-22 NOTE — ED Notes (Signed)
EDP unsuccessful attempt at lumbar puncture.
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
## 2016-05-22 NOTE — ED Notes (Signed)
Delay in lab draw,  Pt enroute to CT. 
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
## 2016-05-22 NOTE — ED Notes (Signed)
Clydie BraunKaren Phlebotomist will be drawing labs.  Patient hard stick.

## 2016-05-22 NOTE — ED Notes (Signed)
On way to CT 

## 2016-05-22 NOTE — ED Notes (Signed)
EDP given nurse order for ativan. Vital signs stable.

## 2016-05-22 NOTE — ED Notes (Signed)
Pt went to CT. CT contacted this nurse stating patient having seizure.  This nurse on way to CT with EDP.
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
## 2016-05-22 NOTE — ED Notes (Signed)
Code Sepsis @ 1721 (RN notified)

## 2016-05-22 NOTE — ED Notes (Signed)
Consulting and admitting Provider at bedside.

## 2016-05-22 NOTE — ED Notes (Signed)
Unable to get MRI due to not being still or laying flat. Patient back in room.

## 2016-05-22 NOTE — ED Notes (Signed)
Pt taken to CT.

## 2016-05-22 NOTE — ED Notes (Signed)
IV team at bedside 

## 2016-05-22 NOTE — ED Notes (Signed)
Lactic Acid results given to Dr. Rush Landmarkegeler
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
## 2016-05-22 NOTE — ED Notes (Signed)
EDP at bedside.  Attempting to do lumbar puncture on patient.

## 2016-05-22 NOTE — ED Notes (Signed)
On way to MRI 

## 2016-05-23 ENCOUNTER — Inpatient Hospital Stay (HOSPITAL_COMMUNITY): Payer: Medicaid Other

## 2016-05-23 ENCOUNTER — Encounter (HOSPITAL_COMMUNITY): Payer: Self-pay | Admitting: *Deleted

## 2016-05-23 DIAGNOSIS — B1920 Unspecified viral hepatitis C without hepatic coma: Secondary | ICD-10-CM | POA: Diagnosis present

## 2016-05-23 DIAGNOSIS — G0490 Encephalitis and encephalomyelitis, unspecified: Secondary | ICD-10-CM | POA: Diagnosis not present

## 2016-05-23 DIAGNOSIS — F419 Anxiety disorder, unspecified: Secondary | ICD-10-CM | POA: Diagnosis present

## 2016-05-23 DIAGNOSIS — R509 Fever, unspecified: Secondary | ICD-10-CM | POA: Diagnosis not present

## 2016-05-23 DIAGNOSIS — R569 Unspecified convulsions: Secondary | ICD-10-CM | POA: Diagnosis present

## 2016-05-23 DIAGNOSIS — J45909 Unspecified asthma, uncomplicated: Secondary | ICD-10-CM | POA: Diagnosis present

## 2016-05-23 DIAGNOSIS — G9340 Encephalopathy, unspecified: Secondary | ICD-10-CM | POA: Diagnosis present

## 2016-05-23 DIAGNOSIS — R51 Headache: Secondary | ICD-10-CM | POA: Diagnosis not present

## 2016-05-23 DIAGNOSIS — F1910 Other psychoactive substance abuse, uncomplicated: Secondary | ICD-10-CM | POA: Diagnosis not present

## 2016-05-23 DIAGNOSIS — F13231 Sedative, hypnotic or anxiolytic dependence with withdrawal delirium: Secondary | ICD-10-CM | POA: Diagnosis present

## 2016-05-23 DIAGNOSIS — F17210 Nicotine dependence, cigarettes, uncomplicated: Secondary | ICD-10-CM | POA: Diagnosis present

## 2016-05-23 DIAGNOSIS — R519 Headache, unspecified: Secondary | ICD-10-CM

## 2016-05-23 DIAGNOSIS — R740 Nonspecific elevation of levels of transaminase and lactic acid dehydrogenase [LDH]: Secondary | ICD-10-CM | POA: Diagnosis not present

## 2016-05-23 DIAGNOSIS — G039 Meningitis, unspecified: Secondary | ICD-10-CM | POA: Diagnosis not present

## 2016-05-23 DIAGNOSIS — Z91128 Patient's intentional underdosing of medication regimen for other reason: Secondary | ICD-10-CM | POA: Diagnosis not present

## 2016-05-23 DIAGNOSIS — T424X6A Underdosing of benzodiazepines, initial encounter: Secondary | ICD-10-CM | POA: Diagnosis present

## 2016-05-23 DIAGNOSIS — A419 Sepsis, unspecified organism: Secondary | ICD-10-CM

## 2016-05-23 DIAGNOSIS — F1020 Alcohol dependence, uncomplicated: Secondary | ICD-10-CM | POA: Diagnosis present

## 2016-05-23 LAB — HIV ANTIBODY (ROUTINE TESTING W REFLEX): HIV Screen 4th Generation wRfx: NONREACTIVE

## 2016-05-23 LAB — RAPID URINE DRUG SCREEN, HOSP PERFORMED
AMPHETAMINES: NOT DETECTED
Barbiturates: NOT DETECTED
Benzodiazepines: POSITIVE — AB
COCAINE: NOT DETECTED
OPIATES: NOT DETECTED
TETRAHYDROCANNABINOL: NOT DETECTED

## 2016-05-23 LAB — CBC
HEMATOCRIT: 35.4 % — AB (ref 39.0–52.0)
Hemoglobin: 11.8 g/dL — ABNORMAL LOW (ref 13.0–17.0)
MCH: 28.9 pg (ref 26.0–34.0)
MCHC: 33.3 g/dL (ref 30.0–36.0)
MCV: 86.8 fL (ref 78.0–100.0)
Platelets: 135 10*3/uL — ABNORMAL LOW (ref 150–400)
RBC: 4.08 MIL/uL — ABNORMAL LOW (ref 4.22–5.81)
RDW: 14.5 % (ref 11.5–15.5)
WBC: 13.9 10*3/uL — ABNORMAL HIGH (ref 4.0–10.5)

## 2016-05-23 LAB — URINE CULTURE: CULTURE: NO GROWTH

## 2016-05-23 LAB — BASIC METABOLIC PANEL
ANION GAP: 12 (ref 5–15)
BUN: 6 mg/dL (ref 6–20)
CALCIUM: 8.6 mg/dL — AB (ref 8.9–10.3)
CHLORIDE: 104 mmol/L (ref 101–111)
CO2: 19 mmol/L — AB (ref 22–32)
CREATININE: 0.95 mg/dL (ref 0.61–1.24)
GFR calc Af Amer: >60 mL/min (ref >60)
GFR calc non Af Amer: >60 mL/min (ref >60)
Glucose, Bld: 118 mg/dL — ABNORMAL HIGH (ref 65–99)
Potassium: 3.9 mmol/L (ref 3.5–5.1)
Sodium: 135 mmol/L (ref 135–145)

## 2016-05-23 LAB — MRSA PCR SCREENING: MRSA by PCR: NEGATIVE

## 2016-05-23 MED ORDER — DEXTROSE 5 % IV SOLN
10.0000 mg/kg | Freq: Three times a day (TID) | INTRAVENOUS | Status: DC
  Administered 2016-05-23 – 2016-05-25 (×6): 580 mg via INTRAVENOUS
  Filled 2016-05-23 (×9): qty 11.6

## 2016-05-23 MED ORDER — ONDANSETRON HCL 4 MG/2ML IJ SOLN: 4.0000 mg | Freq: Four times a day (QID) | INTRAMUSCULAR | Status: DC | PRN

## 2016-05-23 MED ORDER — ALPRAZOLAM 0.25 MG PO TABS: 0.2500 mg | ORAL_TABLET | Freq: Three times a day (TID) | ORAL | Status: DC | PRN

## 2016-05-23 MED ORDER — LORAZEPAM 2 MG/ML IJ SOLN: 1.0000 mg | Freq: Four times a day (QID) | INTRAMUSCULAR | Status: DC | PRN

## 2016-05-23 MED ORDER — VITAMIN B-1 100 MG PO TABS
100.0000 mg | ORAL_TABLET | Freq: Every day | ORAL | Status: DC
  Administered 2016-05-24 – 2016-05-25 (×2): 100 mg via ORAL
  Filled 2016-05-23 (×2): qty 1

## 2016-05-23 MED ORDER — ACETAMINOPHEN 650 MG RE SUPP
650.0000 mg | Freq: Once | RECTAL | Status: AC
  Administered 2016-05-23: 650 mg via RECTAL
  Filled 2016-05-23: qty 1

## 2016-05-23 MED ORDER — LORAZEPAM 2 MG/ML IJ SOLN
1.0000 mg | Freq: Once | INTRAMUSCULAR | Status: AC
  Administered 2016-05-24: 1 mg via INTRAVENOUS
  Filled 2016-05-23: qty 1

## 2016-05-23 MED ORDER — DEXTROSE 5 % IV SOLN
2.0000 g | Freq: Two times a day (BID) | INTRAVENOUS | Status: DC
  Administered 2016-05-23 – 2016-05-24 (×4): 2 g via INTRAVENOUS
  Filled 2016-05-23 (×5): qty 2

## 2016-05-23 MED ORDER — ADULT MULTIVITAMIN W/MINERALS CH
1.0000 | ORAL_TABLET | Freq: Every day | ORAL | Status: DC
  Administered 2016-05-24 – 2016-05-25 (×2): 1 via ORAL
  Filled 2016-05-23 (×2): qty 1

## 2016-05-23 MED ORDER — THIAMINE HCL 100 MG/ML IJ SOLN
100.0000 mg | Freq: Every day | INTRAMUSCULAR | Status: DC
  Filled 2016-05-23: qty 2

## 2016-05-23 MED ORDER — ONDANSETRON HCL 4 MG PO TABS: 4.0000 mg | ORAL_TABLET | Freq: Four times a day (QID) | ORAL | Status: DC | PRN

## 2016-05-23 MED ORDER — SODIUM CHLORIDE 0.9 % IV SOLN
INTRAVENOUS | Status: DC
  Administered 2016-05-23 – 2016-05-24 (×2): via INTRAVENOUS

## 2016-05-23 MED ORDER — ACETAMINOPHEN 325 MG PO TABS: 650.0000 mg | ORAL_TABLET | Freq: Four times a day (QID) | ORAL | Status: DC | PRN

## 2016-05-23 MED ORDER — LORAZEPAM 1 MG PO TABS: 1.0000 mg | ORAL_TABLET | Freq: Four times a day (QID) | ORAL | Status: DC | PRN

## 2016-05-23 MED ORDER — SODIUM CHLORIDE 0.9 % IV SOLN
500.0000 mg | Freq: Two times a day (BID) | INTRAVENOUS | Status: DC
  Administered 2016-05-23 – 2016-05-24 (×4): 500 mg via INTRAVENOUS
  Filled 2016-05-23 (×5): qty 5

## 2016-05-23 MED ORDER — SODIUM CHLORIDE 0.9% FLUSH
3.0000 mL | Freq: Two times a day (BID) | INTRAVENOUS | Status: DC
  Administered 2016-05-23 – 2016-05-25 (×2): 3 mL via INTRAVENOUS

## 2016-05-23 MED ORDER — NICOTINE 7 MG/24HR TD PT24
7.0000 mg | MEDICATED_PATCH | Freq: Every day | TRANSDERMAL | Status: DC
  Administered 2016-05-23 – 2016-05-26 (×4): 7 mg via TRANSDERMAL
  Filled 2016-05-23 (×4): qty 1

## 2016-05-23 MED ORDER — FOLIC ACID 1 MG PO TABS
1.0000 mg | ORAL_TABLET | Freq: Every day | ORAL | Status: DC
  Administered 2016-05-24 – 2016-05-26 (×3): 1 mg via ORAL
  Filled 2016-05-23 (×3): qty 1

## 2016-05-23 MED ORDER — ALPRAZOLAM 0.5 MG PO TABS
0.5000 mg | ORAL_TABLET | Freq: Three times a day (TID) | ORAL | Status: DC | PRN
  Administered 2016-05-23 – 2016-05-25 (×6): 0.5 mg via ORAL
  Filled 2016-05-23 (×6): qty 1

## 2016-05-23 MED ORDER — ACETAMINOPHEN 650 MG RE SUPP
650.0000 mg | Freq: Four times a day (QID) | RECTAL | Status: DC | PRN
Start: 2016-05-23 — End: 2016-05-26

## 2016-05-23 NOTE — Progress Notes (Signed)
Neurology Progress Note  Subjective: Per RN, no major events overnight. He has been sleeping but will rouse to voice. He is somewhat confused, unsure of where he is or why he is here. His participation with ROS and the exam is limited as he wants to remain curled up on the side sleeping. He indicates that he is currently not having any headache. Remainder of assessment is limited by poor effort. No further seizures have been reported.  Medications reviewed and reconciled.   Pertinent meds: Acyclovir 775 mg IV every 8 hours Ceftriaxone 2 g IV every 12 hours Vancomycin 1000 g IV every 8 hours Keppra 500 mg IV every 12 hours  Current Meds:   Current Facility-Administered Medications:  .  0.9 %  sodium chloride infusion, , Intravenous, Continuous, Michael Litter, MD, Last Rate: 100 mL/hr at 05/23/16 0126 .  acetaminophen (TYLENOL) tablet 650 mg, 650 mg, Oral, Q6H PRN **OR** acetaminophen (TYLENOL) suppository 650 mg, 650 mg, Rectal, Q6H PRN, Michael Litter, MD .  acyclovir (ZOVIRAX) 775 mg in dextrose 5 % 150 mL IVPB, 10 mg/kg, Intravenous, Q8H, Marquita Palms, RPH, Last Rate: 165.5 mL/hr at 05/23/16 0612, 775 mg at 05/23/16 0612 .  cefTRIAXone (ROCEPHIN) 2 g in dextrose 5 % 50 mL IVPB, 2 g, Intravenous, Q12H, Michael Litter, MD, 2 g at 05/23/16 0745 .  folic acid (FOLVITE) tablet 1 mg, 1 mg, Oral, Daily, Michael Litter, MD .  levETIRAcetam (KEPPRA) 500 mg in sodium chloride 0.9 % 100 mL IVPB, 500 mg, Intravenous, Q12H, Michael Litter, MD .  LORazepam (ATIVAN) tablet 1 mg, 1 mg, Oral, Q6H PRN **OR** LORazepam (ATIVAN) injection 1 mg, 1 mg, Intravenous, Q6H PRN, Michael Litter, MD .  multivitamin with minerals tablet 1 tablet, 1 tablet, Oral, Daily, Michael Litter, MD .  ondansetron [facility]) tablet 4 mg, 4 mg, Oral, Q6H PRN **OR** ondansetron (ZOFRAN) injection 4 mg, 4 mg, Intravenous, Q6H PRN, Michael Litter, MD .  sodium chloride flush (NS) 0.9 % injection 3 mL, 3 mL, Intravenous, Q12H, Michael Litter, MD .   thiamine (VITAMIN B-1) tablet 100 mg, 100 mg, Oral, Daily **OR** thiamine (B-1) injection 100 mg, 100 mg, Intravenous, Daily, Michael Litter, MD .  vancomycin (VANCOCIN) IVPB 1000 mg/200 mL premix, 1,000 mg, Intravenous, Q8H, Mahala Menghini, RPH, Last Rate: 200 mL/hr at 05/23/16 0316, 1,000 mg at 05/23/16 0316  Objective:  Temp:  [98.3 F (36.8 C)-102.4 F (39.1 C)] 98.4 F (36.9 C) (03/21 0726) Pulse Rate:  [79-115] 79 (03/21 0536) Resp:  [19-31] 22 (03/21 0536) BP: (94-145)/(39-95) 124/70 (03/21 0527) SpO2:  [94 %-100 %] 100 % (03/21 0536) Weight:  [57.8 kg (127 lb 6.8 oz)-77.6 kg (171 lb)] 57.8 kg (127 lb 6.8 oz) (03/21 0527)  General: WDWN AA male lying in bed, curled up on his left side. He does not turn over for me to assess him. He is sleeping but arouses to tactile stimulation. He will answer some questions with mumbled one-word responses. There is a paucity of spontaneous speech. He will follow some simple commands with delay, but again effort is variable. HEENT: Neck is supple without lymphadenopathy. Mucous membranes are moist and the oropharynx is clear. Sclerae are anicteric. There is no conjunctival injection.  CV: Regular, no murmur.  Distal pulses 2+ and symmetric.  Lungs: CTAB  Extremities: No C/C/E. Neuro: MS: As noted above.  CN: Pupils are equal and reactive from 3-->2 mm bilaterally. Eyes are conjugate. Corneals are intact bilaterally. His face appears to be grossly  symmetric. Hearing appears to be intact to conversational voice. Tongue protrudes to midline. The remainder of his cranial examination is limited by poor effort.  Motor: Normal bulk, tone. He gives poor effort with confrontational testing but appears to have normal strength throughout. No tremor or other abnormal movements are observed.  Sensation: Appears intact to light touch but effort is poor.  DTRs: 2+, symmetric. Toes are downgoing bilaterally. No pathological reflexes.  Coordination and gait: Not  assessed as patient does not really participate with the examination.   Labs: Lab Results  Component Value Date   WBC 13.9 (H) 05/23/2016   HGB 11.8 (L) 05/23/2016   HCT 35.4 (L) 05/23/2016   PLT 135 (L) 05/23/2016   GLUCOSE 118 (H) 05/23/2016   ALT 62 05/22/2016   AST 75 (H) 05/22/2016   NA 135 05/23/2016   K 3.9 05/23/2016   CL 104 05/23/2016   CREATININE 0.95 05/23/2016   BUN 6 05/23/2016   CO2 19 (L) 05/23/2016   INR 1.04 05/22/2016   CBC Latest Ref Rng & Units 05/23/2016 05/22/2016 03/20/2012  WBC 4.0 - 10.5 K/uL 13.9(H) 18.3(H) -  Hemoglobin 13.0 - 17.0 g/dL 11.8(L) 12.9(L) 13.3  Hematocrit 39.0 - 52.0 % 35.4(L) 38.6(L) -  Platelets 150 - 400 K/uL 135(L) 267 -    No results found for: HGBA1C Lab Results  Component Value Date   ALT 62 05/22/2016   AST 75 (H) 05/22/2016   ALKPHOS 68 05/22/2016   BILITOT 0.6 05/22/2016   Urinalysis notable for moderate hemoglobin, ketones 5, protein 30 Urine drug screen positive for benzodiazepines Lactic acid 1.68  Blood cultures pending 2  Radiology:  I have personally and independently reviewed CT scan of the head without contrast from 05/22/16. This is unremarkable.   A/P:   1. Possible meningoencephalitis: Presentation with altered mental status, seizure, and fever are certainly concerning for CNS infection. Maintain etiologic considerations in this case would be pneumococcus and HSV. He also has a peripheral leukocytosis which would be expected with CNS infection, though could also be in part due to demargination following seizure activity. LP was unsuccessful in the emergency department. This is to be done under fluoroscopy today and will follow-up on CSF results once available to further refine antimicrobial therapy. MRI has been ordered as well given seizure activity and concern for encephalitis. This will be reviewed upon its completion.  2. Seizure: By report, this was a first-time seizure. This occurred in the setting of  presumed CNS infection would therefore be considered provoked. Continue Keppra for now, though this may not be required long-term if this is truly provoked seizure activity. Continue seizure precautions.  3. Acute encephalopathy: This may be multifactorial at this point with contribution from possible CNS infection, seizure with postictal state, and medication effect. Continue to optimize metabolic status is necessary. Continue antibiotics for presumed infection. Limit CNS active medications, particularly benzodiazepines, opiates, and anything with strong anticholinergic properties. Optimize circadian rhythms by keeping the room bright and active during the day, dark and quiet at night.  Needs outpatient neurology follow-up?: He would likely benefit from outpatient follow-up after discharge and this can be arranged at that time.   This was discussed with the patient and visiting family member. Education was provided on the diagnosis and expected evaluation and treatment. They are in agreement with the plan as noted. They were given the opportunity to ask any questions and these were addressed to their satisfaction.   Rhona Leavensimothy Oster, MD [facility]

## 2016-05-23 NOTE — Progress Notes (Signed)
[place] TEAM 1 - Stepdown/ICU TEAM  Noah Noah [name]  ZOX:[id number] DOB: 1980-11-29 DOA: 05/22/2016 PCP: Pcp Not In System    Brief Narrative:  36 y.o. M with a history of asthma, polysubstance abuse including IV drug abuse, and EtOH dependence who presented to the ED for evaluation of new onset seizure activity with intractable headache, fever, neck stiffness.  Patient received a total of 6mg  of IV ativan in the ED for seizure activity, then combativeness.  The patient camped outside with his children the night prior, but had no reported bites or rashes.  9AM the morning of his presentation he began to c/o intractable headache.  He did not want to come to the hospital but ultimately was found down drooling from the side of his mouth.  While attending to him his wife witnessed convulsions, followed by what appeared to be a post-ictal phase.  EMS was called.  The patient required assisted ventilations en route to the ED (but no chest compressions).  He had recurrent seizure activity in the ED.  The patient received IV decadron, rocephin, and vancomycin.  LP attempt by the ED attending was unsuccessful.    Subjective: Pt is seen for a f/u visit.    Assessment & Plan:  Meningitis / Acute encephalitis   New onset seizure  History of polysubstance abuse  DVT prophylaxis: SCDs Code Status: FULL CODE Family Communication: no family present at time of exam  Disposition Plan:   Consultants:  Neurology   Procedures: none  Antimicrobials:  Acyclovir 3/20 > Ceftriaxone 3/20 > Vancomycin 3/20 >  Objective: Noah [name] pressure 111/67, pulse 91, temperature 98.6 F (37 C), temperature source Oral, resp. rate 17, height 5' 10.87" (1.8 m), weight 57.8 kg (127 lb 6.8 oz), SpO2 99 %.  Intake/Output Summary (Last 24 hours) at 05/23/16 1536 Last data filed at 05/23/16 1200  Gross per 24 hour  Intake          6023.17 ml  Output              800 ml  Net          5223.17 ml   Filed Weights   05/22/16 1647 05/23/16 0527  Weight: 77.6 kg (171 lb) 57.8 kg (127 lb 6.8 oz)    Examination: Pt was seen for a f/u visit.    CBC:  Recent Labs Lab 05/22/16 1715 05/23/16 0142  WBC 18.3* 13.9*  NEUTROABS 16.2*  --   HGB 12.9* 11.8*  HCT 38.6* 35.4*  MCV 86.9 86.8  PLT 267 135*   Basic Metabolic Panel:  Recent Labs Lab 05/22/16 1715 05/23/16 0142  NA 137 135  K 4.3 3.9  CL 102 104  CO2 21* 19*  GLUCOSE 98 118*  BUN 9 6  CREATININE 1.00 0.95  CALCIUM 9.8 8.6*   GFR: Estimated Creatinine Clearance: 89.6 mL/min (by C-G formula based on SCr of 0.95 mg/dL).  Liver Function Tests:  Recent Labs Lab 05/22/16 1715  AST 75*  ALT 62  ALKPHOS 68  BILITOT 0.6  PROT 8.7*  ALBUMIN 4.5    Recent Labs Lab 05/22/16 1715  LIPASE <10*   Coagulation Profile:  Recent Labs Lab 05/22/16 1715  INR 1.04    Recent Results (from the past 240 hour(s))  Noah [name] culture (routine x 2)     Status: None (Preliminary result)   Collection Time: 05/22/16  5:15 PM  Result Value Ref Range Status   Specimen Description Noah [name] LEFT ANTECUBITAL  Final  Special Requests IN PEDIATRIC BOTTLE 3CC  Final   Culture NO GROWTH < 24 HOURS  Final   Report Status PENDING  Incomplete  Urine culture     Status: None   Collection Time: 05/22/16  5:51 PM  Result Value Ref Range Status   Specimen Description URINE, RANDOM  Final   Special Requests NONE  Final   Culture NO GROWTH  Final   Report Status 05/23/2016 FINAL  Final  MRSA PCR Screening     Status: None   Collection Time: 05/23/16  6:20 AM  Result Value Ref Range Status   MRSA by PCR NEGATIVE NEGATIVE Final    Comment:        The GeneXpert MRSA Assay (FDA approved for NASAL specimens only), is one component of a comprehensive MRSA colonization surveillance program. It is not intended to diagnose MRSA infection nor to guide or monitor treatment for MRSA infections.      Scheduled Meds: . acyclovir  10 mg/kg Intravenous Q8H    . cefTRIAXone (ROCEPHIN)  IV  2 g Intravenous Q12H  . folic acid  1 mg Oral Daily  . levETIRAcetam  500 mg Intravenous Q12H  . multivitamin with minerals  1 tablet Oral Daily  . sodium chloride flush  3 mL Intravenous Q12H  . thiamine  100 mg Oral Daily   Or  . thiamine  100 mg Intravenous Daily  . vancomycin  1,000 mg Intravenous Q8H   Continuous Infusions: . sodium chloride 100 mL/hr at 05/23/16 1200     LOS: 0 days   Time spent: No Charge  Noah BloodJeffrey T. Shamonica Schadt, MD [facility] Office  [phone] Pager - Text Page per Amion as per below:  On-Call/Text Page:      [email or web address]      password [email or web address]  If 7PM-7AM, please contact night-coverage [email or web address] Password [email or web address] 05/23/2016, 3:36 PM
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
## 2016-05-23 NOTE — Procedures (Signed)
ELECTROENCEPHALOGRAM REPORT  Date of Study: 05/23/2016  Patient's Name: Noah [name] MRN: [id number] Date of Birth: June 25, 1980  Referring Provider: Dr. Michael LitterNikki Carter  Clinical History: This is a 36 year old man with change in mental status then witnessed convulsion.  Medications: levETIRAcetam (KEPPRA) 500 mg in sodium chloride 0.9 % 100 mL IVPB  acetaminophen (TYLENOL) tablet 650 mg  acyclovir (ZOVIRAX) 775 mg in dextrose 5 % 150 mL IVPB  cefTRIAXone (ROCEPHIN) 2 g in dextrose 5 % 50 mL IVPB  folic acid (FOLVITE) tablet 1 mg  multivitamin with minerals tablet 1 tablet  thiamine (B-1) injection 100 mg  vancomycin (VANCOCIN) IVPB 1000 mg/200 mL premix   Technical Summary: A multichannel digital EEG recording measured by the international 10-20 system with electrodes applied with paste and impedances below 5000 ohms performed in our laboratory with EKG monitoring in an awake and asleep patient.  Hyperventilation was not performed. Photic stimulation was performed.  The digital EEG was referentially recorded, reformatted, and digitally filtered in a variety of bipolar and referential montages for optimal display.    Description: The patient is awake and asleep during the recording.  During maximal wakefulness, there is a symmetric, medium voltage 9 Hz posterior dominant rhythm that attenuates with eye opening.  The record is symmetric.  During drowsiness and sleep, there is an increase in theta slowing of the background.  Vertex waves and symmetric sleep spindles were seen.  Hyperventilation and photic stimulation did not elicit any abnormalities.  There were no epileptiform discharges or electrographic seizures seen.    EKG lead was unremarkable.  Impression: This awake and asleep EEG is normal.    Clinical Correlation: A normal EEG does not exclude a clinical diagnosis of epilepsy. Clinical correlation is advised.   Patrcia DollyKaren Clete Kuch, M.D.

## 2016-05-23 NOTE — Care Management Note (Addendum)
Case Management Note  Patient Details  Name: Noah [name] MRN: [id number] Date of Birth: 06/09/1980  Subjective/Objective:    From home with wife, presents with new onset seizure, Headaches, fever and neck stiffness.  For EEG , IR to do LP to rule out meningitis.  He states he has medicaid that covers his medications, he has [facility], [facility].  pta indep. NCM will cont to follow for dc needs.                 Action/Plan:   Expected Discharge Date:                  Expected Discharge Plan:     In-House Referral:     Discharge planning Services  CM Consult  Post Acute Care Choice:    Choice offered to:     DME Arranged:    DME Agency:     HH Arranged:    HH Agency:     Status of Service:  In process, will continue to follow  If discussed at Long Length of Stay Meetings, dates discussed:    Additional Comments:  Leone Havenaylor, Denaly Gatling Clinton, RN 05/23/2016, 12:21 PM

## 2016-05-23 NOTE — Progress Notes (Signed)
EEG completed, results pending. 

## 2016-05-23 NOTE — Progress Notes (Signed)
Entered pt's room and he was trying to get out of bed to go to bathroom with cigarette and matches. Got pt back into bed and took cigarette and matches and gave them to his girlfriend to put away. MD notified. Marisue Ivanobyn Kaipo Ardis RN

## 2016-05-23 NOTE — ED Notes (Signed)
Attempted report, left number for callback 

## 2016-05-24 ENCOUNTER — Inpatient Hospital Stay (HOSPITAL_COMMUNITY): Payer: Medicaid Other

## 2016-05-24 DIAGNOSIS — G0490 Encephalitis and encephalomyelitis, unspecified: Secondary | ICD-10-CM

## 2016-05-24 LAB — CSF CELL COUNT WITH DIFFERENTIAL
RBC COUNT CSF: 1 /mm3 — AB
RBC COUNT CSF: 63 /mm3 — AB
TUBE #: 1
Tube #: 4
WBC CSF: 1 /mm3 (ref 0–5)
WBC, CSF: 1 /mm3 (ref 0–5)

## 2016-05-24 LAB — CBC
HEMATOCRIT: 36.7 % — AB (ref 39.0–52.0)
HEMOGLOBIN: 12 g/dL — AB (ref 13.0–17.0)
MCH: 28.8 pg (ref 26.0–34.0)
MCHC: 32.7 g/dL (ref 30.0–36.0)
MCV: 88.2 fL (ref 78.0–100.0)
PLATELETS: 236 10*3/uL (ref 150–400)
RBC: 4.16 MIL/uL — AB (ref 4.22–5.81)
RDW: 14.7 % (ref 11.5–15.5)
WBC: 10.3 10*3/uL (ref 4.0–10.5)

## 2016-05-24 LAB — COMPREHENSIVE METABOLIC PANEL
ALT: 69 U/L — ABNORMAL HIGH (ref 17–63)
ANION GAP: 12 (ref 5–15)
AST: 487 U/L — ABNORMAL HIGH (ref 15–41)
Albumin: 3.3 g/dL — ABNORMAL LOW (ref 3.5–5.0)
Alkaline Phosphatase: 48 U/L (ref 38–126)
BUN: 8 mg/dL (ref 6–20)
CO2: 23 mmol/L (ref 22–32)
CREATININE: 0.87 mg/dL (ref 0.61–1.24)
Calcium: 8.7 mg/dL — ABNORMAL LOW (ref 8.9–10.3)
Chloride: 102 mmol/L (ref 101–111)
Glucose, Bld: 85 mg/dL (ref 65–99)
POTASSIUM: 3.8 mmol/L (ref 3.5–5.1)
SODIUM: 137 mmol/L (ref 135–145)
Total Bilirubin: 0.5 mg/dL (ref 0.3–1.2)
Total Protein: 6.7 g/dL (ref 6.5–8.1)

## 2016-05-24 LAB — PROTEIN, CSF: Total  Protein, CSF: 23 mg/dL (ref 15–45)

## 2016-05-24 LAB — GLUCOSE, CSF: Glucose, CSF: 60 mg/dL (ref 40–70)

## 2016-05-24 MED ORDER — GADOBENATE DIMEGLUMINE 529 MG/ML IV SOLN
10.0000 mL | Freq: Once | INTRAVENOUS | Status: AC | PRN
  Administered 2016-05-24: 10 mL via INTRAVENOUS

## 2016-05-24 MED ORDER — LIDOCAINE HCL (PF) 1 % IJ SOLN
5.0000 mL | Freq: Once | INTRAMUSCULAR | Status: AC
  Administered 2016-05-24: 5 mL via INTRADERMAL

## 2016-05-24 NOTE — Progress Notes (Signed)
Dear Doctor:   This patient has been identified as a candidate for PICC for the following reason (s): IV therapy over 48 hours, poor veins/poor circulatory system (CHF, COPD, emphysema, diabetes, steroid use, IV drug abuse, etc.) and restarts due to phlebitis and infiltration in 24 hours If you agree, please write an order for the indicated device. For any questions contact the Vascular Access Team at [phone] if no answer, please leave a message.  Thank you for supporting the early vascular access assessment program. 

## 2016-05-24 NOTE — Progress Notes (Signed)
Pt has MRI scheduled. Pt stated he was claustrophobic and would need something for scan. MD paged. MD ordered ativan 1mg  IV for on call to MRI. Will continue to monitor and pass info to oncoming RN.

## 2016-05-24 NOTE — Progress Notes (Signed)
Neurology Progress Note  Subjective: LP not yet done--it appears that this was never actually ordered yesterday. No other significant 24 hour events. The patient is much more alert this morning. He feels like he is doing well apart from some cramping in both legs. His significant other at the bedside reports that he has some difficulty with his short-term memory and tends to forger conversations held just minutes before. He also has a tendency to repeat himself. He denies any headache or neck discomfort. 12-point ROS is otherwise negative.    Medications reviewed and reconciled.   Pertinent meds: Acyclovir 580 mg IV every 8 hours Ceftriaxone 2 g IV every 12 hours Vancomycin 1000 g IV every 8 hours Keppra 500 mg IV every 12 hours  Current Meds:   Current Facility-Administered Medications:  .  0.9 %  sodium chloride infusion, , Intravenous, Continuous, Lonia Blood, MD, Last Rate: 10 mL/hr at 05/24/16 0912 .  acetaminophen (TYLENOL) tablet 650 mg, 650 mg, Oral, Q6H PRN **OR** acetaminophen (TYLENOL) suppository 650 mg, 650 mg, Rectal, Q6H PRN, Michael Litter, MD .  acyclovir (ZOVIRAX) 580 mg in dextrose 5 % 100 mL IVPB, 10 mg/kg, Intravenous, Q8H, Bertram Millard, RPH, 580 mg at 05/24/16 4098 .  ALPRAZolam Prudy Feeler) tablet 0.5 mg, 0.5 mg, Oral, TID PRN, Lonia Blood, MD, 0.5 mg at 05/24/16 0716 .  cefTRIAXone (ROCEPHIN) 2 g in dextrose 5 % 50 mL IVPB, 2 g, Intravenous, Q12H, Michael Litter, MD, 2 g at 05/24/16 1191 .  folic acid (FOLVITE) tablet 1 mg, 1 mg, Oral, Daily, Michael Litter, MD, 1 mg at 05/24/16 0917 .  levETIRAcetam (KEPPRA) 500 mg in sodium chloride 0.9 % 100 mL IVPB, 500 mg, Intravenous, Q12H, Michael Litter, MD, 500 mg at 05/24/16 4782 .  multivitamin with minerals tablet 1 tablet, 1 tablet, Oral, Daily, Michael Litter, MD, 1 tablet at 05/24/16 0917 .  nicotine (NICODERM CQ - dosed in mg/24 hr) patch 7 mg, 7 mg, Transdermal, Daily, Lonia Blood, MD, 7 mg at 05/24/16 9562 .   ondansetron (ZOFRAN) tablet 4 mg, 4 mg, Oral, Q6H PRN **OR** ondansetron (ZOFRAN) injection 4 mg, 4 mg, Intravenous, Q6H PRN, Michael Litter, MD .  sodium chloride flush (NS) 0.9 % injection 3 mL, 3 mL, Intravenous, Q12H, Michael Litter, MD, 3 mL at 05/23/16 1112 .  thiamine (VITAMIN B-1) tablet 100 mg, 100 mg, Oral, Daily, 100 mg at 05/24/16 0916 **OR** thiamine (B-1) injection 100 mg, 100 mg, Intravenous, Daily, Michael Litter, MD .  vancomycin (VANCOCIN) IVPB 1000 mg/200 mL premix, 1,000 mg, Intravenous, Q8H, Mahala Menghini, RPH, Last Rate: 200 mL/hr at 05/24/16 0912, 1,000 mg at 05/24/16 0912  Objective:  Temp:  [98 F (36.7 C)-98.9 F (37.2 C)] 98 F (36.7 C) (03/22 0715) Pulse Rate:  [80-98] 87 (03/22 0715) Resp:  [16-25] 20 (03/22 0412) BP: (96-114)/(64-77) 101/67 (03/22 0715) SpO2:  [98 %-100 %] 100 % (03/22 0715)  General: WDWN AA male lying in bed. He is alert and appropriate. Spontaneous recall is 1/3 items at 3 minutes. He is able to get the other items with recognition and semantic cues. He made one error when spelling "world" backwards. Speech is clear, no dysarthria. No aphasia.  HEENT: Neck is supple without lymphadenopathy. Mucous membranes are moist and the oropharynx is clear. Sclerae are anicteric. There is no conjunctival injection.  CV: Regular, no murmur.  Distal pulses 2+ and symmetric.  Lungs: CTAB   Neuro: MS: As noted above.  CN:  Pupils are equal and reactive from 3-->2 mm bilaterally. Visual fields are full. EOM intact, no nystagmus. Facial sensation is intact to light touch. His face is symmetric at rest with normal strength and mobility. Hearing appears to be intact to conversational voice. His palate elevates symmetrically and uvula is midline. B SCM and trapezius strength is 5/5. Tongue midline.  Motor: Normal bulk, tone and strength throughout. No tremor or other abnormal movements are observed. No drift.  Sensation: Appears intact to light touch.  DTRs: 2+,  symmetric. Toes are downgoing bilaterally. No pathological reflexes.  Coordination: FTN without dysmetria.   Labs: Lab Results  Component Value Date   WBC 10.3 05/24/2016   HGB 12.0 (L) 05/24/2016   HCT 36.7 (L) 05/24/2016   PLT 236 05/24/2016   GLUCOSE 85 05/24/2016   ALT 69 (H) 05/24/2016   AST 487 (H) 05/24/2016   NA 137 05/24/2016   K 3.8 05/24/2016   CL 102 05/24/2016   CREATININE 0.87 05/24/2016   BUN 8 05/24/2016   CO2 23 05/24/2016   INR 1.04 05/22/2016   CBC Latest Ref Rng & Units 05/24/2016 05/23/2016 05/22/2016  WBC 4.0 - 10.5 K/uL 10.3 13.9(H) 18.3(H)  Hemoglobin 13.0 - 17.0 g/dL 12.0(L) 11.8(L) 12.9(L)  Hematocrit 39.0 - 52.0 % 36.7(L) 35.4(L) 38.6(L)  Platelets 150 - 400 K/uL 236 135(L) 267    No results found for: HGBA1C Lab Results  Component Value Date   ALT 69 (H) 05/24/2016   AST 487 (H) 05/24/2016   ALKPHOS 48 05/24/2016   BILITOT 0.5 05/24/2016   AST 75-->487 ALT 62-->69 HIV nonreactive  Blood cultures pending 2  Radiology:  I have personally and independently reviewed the MRI brain with and without contrast from today. This is notable for some motion artifact. This scan appears unremarkable otherwise.   A/P:   1. 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 MRI brain shows no evidence of encephalitis but is somewhat limited by patient motion. LP has not yet been done but has now been ordered under fluoro. Will follow up on CSF results when available; these will determine the course moving forward.   2. Seizure: This was a first-time seizure. This occurred in the setting of presumed CNS infection would therefore be considered provoked. Today the patient also tells me that he ran out of his Xanax prior to his admission and he thinks that this could be the reason for his presentation. Continue Keppra for now, though this may not be required long-term if this is truly provoked seizure activity. Continue seizure  precautions. EEG normal.   3. Acute encephalopathy: This has improved and he is much more alert today though appears to have some mild impairments in memory retrieval and sustained attention. This was most likely multifactorial in etiology with contributions from possible CNS infection, seizure with postictal state, metabolic derangement (AST is rising) and medication effect. Continue to optimize metabolic status as necessary. Continue antibiotics for presumed infection. Limit CNS active medications, particularly benzodiazepines, opiates, and anything with strong anticholinergic properties. Optimize circadian rhythms by keeping the room bright and active during the day, dark and quiet at night.  Needs outpatient neurology follow-up?: Recommend outpatient f/u 2-3 weeks after discharge.   This was discussed with the patient and significant other. Education was provided on the diagnosis and expected evaluation and treatment. They are in agreement with the plan as noted. They were given the opportunity to ask any questions and these were addressed to their satisfaction.  Melba Coon, MD [facility]

## 2016-05-24 NOTE — Progress Notes (Signed)
PROGRESS NOTE    Noah [name]  NGE:[id number] DOB: December 09, 1980 DOA: 05/22/2016 PCP: Pcp Not In System     Brief Narrative:  Noah [name] is a 36 y.o.M with a history of asthma, polysubstance abuse including IV drug abuse, and EtOH dependence who presented to the ED for evaluation of new onset seizure activity with intractable headache, fever, neck stiffness. Patient received a total of 6mg  of IV ativan in the ED for seizure activity, then combativeness. Patient had camped outside with his children the night prior, but had no reported bites or rashes. 9AM the morning of his presentation he began to c/o intractable headache.He did not want to come to the hospital but ultimately was found down drooling from the side of his mouth. While attending to him his wife witnessed convulsions, followed by what appeared to be a post-ictal phase. EMS was called. The patient required assisted ventilations en route to the ED (but no chest compressions). He had recurrent seizure activity in the ED. The patient received IV decadron, rocephin, and vancomycin. LP attempt by the ED attending was unsuccessful. Neurology consulted.    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 -LP with fluoroscopy today -MRI brain negative  -Neurology following -Follow up on CSF labs -Continue empiric vancomycin, Rocephin, acyclovir  Acute encephalopathy -Multifactorial including possible CNS infection, seizure, metabolic -Improved today   Elevated liver enzymes -Continue to trend LFT  Anxiety -Xanax    DVT prophylaxis: SCDs Code Status: Full Family Communication: at bedside Disposition Plan: pending LP results    Consultants:   Neurology   Procedures:   LP   Antimicrobials:    Acyclovir 3/20 >  Ceftriaxone 3/20 >  Vancomycin 3/20 >   Subjective: Patient feeling well today. Denies any fevers, neck stiffness or pain, visual changes, focal neurologic deficits, chest pain, shortness of breath, nausea or vomiting. Awaiting LP today. No acute events overnight.  Objective: Vitals:   05/23/16 2329 05/24/16 0412 05/24/16 0715 05/24/16 1205  BP: 108/64 96/70 101/67 123/74  Pulse: 98 80 87 82  Resp: 16 20  18   Temp: 98.3 F (36.8 C) 98.2 F (36.8 C) 98 F (36.7 C) 98.4 F (36.9 C)  TempSrc: Oral Oral Oral Oral  SpO2: 99% 98% 100% 100%  Weight:      Height:        Intake/Output Summary (Last 24 hours) at 05/24/16 1218 Last data filed at 05/24/16 1213  Gross per 24 hour  Intake          2609.93 ml  Output             1050 ml  Net          1559.93 ml   Filed Weights   05/22/16 1647 05/23/16 0527  Weight: 77.6 kg (171 lb) 57.8 kg (127 lb 6.8 oz)    Examination:  General exam: Appears calm and comfortable  Respiratory system: Clear to auscultation. Respiratory effort normal. Cardiovascular system: S1 & S2 heard, RRR. No JVD, murmurs, rubs, gallops or clicks. No pedal edema. Gastrointestinal system: Abdomen is nondistended, soft and nontender. No organomegaly or masses felt. Normal bowel sounds heard. Central nervous system: Alert and oriented. No focal neurological deficits. Extremities: Symmetric 5 x 5 power. Skin: No rashes, lesions or ulcers Psychiatry: Judgement  and insight appear normal. Mood & affect appropriate.   Data Reviewed: I have personally reviewed following labs and imaging studies  CBC:  Recent Labs Lab 05/22/16 1715 05/23/16 0142 05/24/16 0405  WBC 18.3* 13.9* 10.3  NEUTROABS 16.2*  --   --   HGB 12.9* 11.8* 12.0*  HCT 38.6* 35.4* 36.7*  MCV 86.9 86.8 88.2  PLT 267 135* 236   Basic Metabolic Panel:  Recent Labs Lab 05/22/16 1715 05/23/16 0142 05/24/16 0405  NA 137 135 137  K 4.3 3.9 3.8  CL 102 104 102  CO2  21* 19* 23  GLUCOSE 98 118* 85  BUN 9 6 8   CREATININE 1.00 0.95 0.87  CALCIUM 9.8 8.6* 8.7*   GFR: Estimated Creatinine Clearance: 97.8 mL/min (by C-G formula based on SCr of 0.87 mg/dL). Liver Function Tests:  Recent Labs Lab 05/22/16 1715 05/24/16 0405  AST 75* 487*  ALT 62 69*  ALKPHOS 68 48  BILITOT 0.6 0.5  PROT 8.7* 6.7  ALBUMIN 4.5 3.3*    Recent Labs Lab 05/22/16 1715  LIPASE <10*   No results for input(s): AMMONIA in the last 168 hours. Coagulation Profile:  Recent Labs Lab 05/22/16 1715  INR 1.04   Cardiac Enzymes: No results for input(s): CKTOTAL, CKMB, CKMBINDEX, TROPONINI in the last 168 hours. BNP (last 3 results) No results for input(s): PROBNP in the last 8760 hours. HbA1C: No results for input(s): HGBA1C in the last 72 hours. CBG: No results for input(s): GLUCAP in the last 168 hours. Lipid Profile: No results for input(s): CHOL, HDL, LDLCALC, TRIG, CHOLHDL, LDLDIRECT in the last 72 hours. Thyroid Function Tests: No results for input(s): TSH, T4TOTAL, FREET4, T3FREE, THYROIDAB in the last 72 hours. Anemia Panel: No results for input(s): VITAMINB12, FOLATE, FERRITIN, TIBC, IRON, RETICCTPCT in the last 72 hours. Sepsis Labs:  Recent Labs Lab 05/22/16 1725 05/22/16 2237  LATICACIDVEN 3.16* 1.68    Recent Results (from the past 240 hour(s))  Blood culture (routine x 2)     Status: None (Preliminary result)   Collection Time: 05/22/16  5:15 PM  Result Value Ref Range Status   Specimen Description BLOOD LEFT ANTECUBITAL  Final   Special Requests IN PEDIATRIC BOTTLE 3CC  Final   Culture NO GROWTH 2 DAYS  Final   Report Status PENDING  Incomplete  Urine culture     Status: None   Collection Time: 05/22/16  5:51 PM  Result Value Ref Range Status   Specimen Description URINE, RANDOM  Final   Special Requests NONE  Final   Culture NO GROWTH  Final   Report Status 05/23/2016 FINAL  Final  Blood culture (routine x 2)     Status: None  (Preliminary result)   Collection Time: 05/22/16 10:25 PM  Result Value Ref Range Status   Specimen Description BLOOD LEFT WRIST  Final   Special Requests AEROBIC BOTTLE ONLY 5ML  Final   Culture NO GROWTH 1 DAY  Final   Report Status PENDING  Incomplete  MRSA PCR Screening     Status: None   Collection Time: 05/23/16  6:20 AM  Result Value Ref Range Status   MRSA by PCR NEGATIVE NEGATIVE Final    Comment:        The GeneXpert MRSA Assay (FDA approved for NASAL specimens only), is one component of a comprehensive MRSA colonization surveillance program. It is not intended to diagnose MRSA infection nor to guide or monitor treatment for MRSA infections.  Radiology Studies: Ct Head Wo Contrast  Result Date: 05/22/2016 CLINICAL DATA:  Syncope, headache, possible seizure activity. EXAM: CT HEAD WITHOUT CONTRAST TECHNIQUE: Contiguous axial images were obtained from the base of the skull through the vertex without intravenous contrast. COMPARISON:  02/28/2012 FINDINGS: Limited exam with some motion artifact. Brain: No evidence of acute infarction, hemorrhage, hydrocephalus, extra-axial collection or mass lesion/mass effect. Vascular: No hyperdense vessel or unexpected calcification. Skull: Normal. Negative for fracture or focal lesion. Sinuses/Orbits: No acute finding. Other: None. IMPRESSION: Normal head CT without contrast. Limited study with motion artifact. Electronically Signed   By: Judie Petit.  Shick M.D.   On: 05/22/2016 19:09   Mr Laqueta Jean WU Contrast  Result Date: 05/24/2016 CLINICAL DATA:  Possible seizure. History of IV drug abuse. Headache, fever and neck stiffness. EXAM: MRI HEAD WITHOUT AND WITH CONTRAST TECHNIQUE: Multiplanar, multiecho pulse sequences of the brain and surrounding structures were obtained without and with intravenous contrast. CONTRAST:  10mL MULTIHANCE GADOBENATE DIMEGLUMINE 529 MG/ML IV SOLN COMPARISON:  Head CT 05/22/2016 FINDINGS: Despite efforts by the  technologist and patient, motion artifact is present on today's examination and could not be eliminated. The findings of this study are interpreted in the context of that limitation. Brain: No focal diffusion restriction to indicate acute infarct. No intraparenchymal hemorrhage. The brain parenchymal signal is normal. No mass lesion or midline shift. No hydrocephalus or extra-axial fluid collection. The midline structures are normal. No age advanced or lobar predominant atrophy. Vascular: Major intracranial arterial and venous sinus flow voids are preserved. No evidence of chronic microhemorrhage or amyloid angiopathy. Skull and upper cervical spine: The visualized skull base, calvarium, upper cervical spine and extracranial soft tissues are normal. Sinuses/Orbits: No fluid levels or advanced mucosal thickening. No mastoid effusion. Normal orbits. IMPRESSION: Normal MRI of the brain. Electronically Signed   By: Deatra Robinson M.D.   On: 05/24/2016 03:54   Dg Chest Portable 1 View  Result Date: 05/22/2016 CLINICAL DATA:  Seizure, headache, suspect meningitis EXAM: PORTABLE CHEST 1 VIEW COMPARISON:  02/28/2012 FINDINGS: Cardiomediastinal silhouette is stable. No infiltrate or pleural effusion. No pulmonary edema. Bony thorax is unremarkable. IMPRESSION: No active disease. Electronically Signed   By: Natasha Mead M.D.   On: 05/22/2016 17:25      Scheduled Meds: . acyclovir  10 mg/kg Intravenous Q8H  . cefTRIAXone (ROCEPHIN)  IV  2 g Intravenous Q12H  . folic acid  1 mg Oral Daily  . levETIRAcetam  500 mg Intravenous Q12H  . multivitamin with minerals  1 tablet Oral Daily  . nicotine  7 mg Transdermal Daily  . sodium chloride flush  3 mL Intravenous Q12H  . thiamine  100 mg Oral Daily   Or  . thiamine  100 mg Intravenous Daily  . vancomycin  1,000 mg Intravenous Q8H   Continuous Infusions: . sodium chloride 10 mL/hr at 05/24/16 0912     LOS: 1 day    Time spent: 40 minutes   Noralee Stain,  DO [facility] [email or web address] Password [facility] 05/24/2016, 12:18 PM

## 2016-05-24 NOTE — Progress Notes (Signed)
Pt has question about time frame for lumbar puncture which he stated he was waiting for all day. RN couldn't find any active orders for LP. MD paged for clarification. MD couldn't find orders either and advised RN to call IR to follow up. Md only found notes for LP but no active order as well. MD stated she would leave a care note to AM team to clarify issue. RN called IR and they stated LP could only be preformed if MD calls for one or emergencies this time of night. Information shared with patient. Will continue to monitor and pass information to oncoming shift RN.

## 2016-05-25 DIAGNOSIS — F1020 Alcohol dependence, uncomplicated: Secondary | ICD-10-CM

## 2016-05-25 DIAGNOSIS — R740 Nonspecific elevation of levels of transaminase and lactic acid dehydrogenase [LDH]: Secondary | ICD-10-CM

## 2016-05-25 LAB — CBC
HEMATOCRIT: 34.5 % — AB (ref 39.0–52.0)
HEMOGLOBIN: 11.4 g/dL — AB (ref 13.0–17.0)
MCH: 28.9 pg (ref 26.0–34.0)
MCHC: 33 g/dL (ref 30.0–36.0)
MCV: 87.3 fL (ref 78.0–100.0)
Platelets: 246 10*3/uL (ref 150–400)
RBC: 3.95 MIL/uL — ABNORMAL LOW (ref 4.22–5.81)
RDW: 14 % (ref 11.5–15.5)
WBC: 6.6 10*3/uL (ref 4.0–10.5)

## 2016-05-25 LAB — COMPREHENSIVE METABOLIC PANEL
ALBUMIN: 2.9 g/dL — AB (ref 3.5–5.0)
ALT: 70 U/L — ABNORMAL HIGH (ref 17–63)
ANION GAP: 9 (ref 5–15)
AST: 535 U/L — AB (ref 15–41)
Alkaline Phosphatase: 47 U/L (ref 38–126)
BUN: 6 mg/dL (ref 6–20)
CO2: 26 mmol/L (ref 22–32)
Calcium: 8.6 mg/dL — ABNORMAL LOW (ref 8.9–10.3)
Chloride: 102 mmol/L (ref 101–111)
Creatinine, Ser: 0.77 mg/dL (ref 0.61–1.24)
GFR calc Af Amer: >60 mL/min (ref >60)
GFR calc non Af Amer: >60 mL/min (ref >60)
GLUCOSE: 104 mg/dL — AB (ref 65–99)
POTASSIUM: 3.9 mmol/L (ref 3.5–5.1)
SODIUM: 137 mmol/L (ref 135–145)
Total Bilirubin: 0.4 mg/dL (ref 0.3–1.2)
Total Protein: 6.3 g/dL — ABNORMAL LOW (ref 6.5–8.1)

## 2016-05-25 LAB — HERPES SIMPLEX VIRUS(HSV) DNA BY PCR: HSV 2 DNA: NEGATIVE

## 2016-05-25 LAB — HSV DNA BY PCR (REFERENCE LAB): HSV 1 DNA: NEGATIVE

## 2016-05-25 MED ORDER — SODIUM CHLORIDE 0.9% FLUSH: 10.0000 mL | INTRAVENOUS | Status: DC | PRN

## 2016-05-25 MED ORDER — THIAMINE HCL 100 MG/ML IJ SOLN
100.0000 mg | Freq: Every day | INTRAMUSCULAR | Status: DC
Start: 2016-05-25 — End: 2016-05-25

## 2016-05-25 MED ORDER — VITAMIN B-1 100 MG PO TABS: 100.0000 mg | ORAL_TABLET | Freq: Every day | ORAL | Status: DC

## 2016-05-25 MED ORDER — ALPRAZOLAM 0.5 MG PO TABS
0.5000 mg | ORAL_TABLET | Freq: Two times a day (BID) | ORAL | Status: DC
  Administered 2016-05-25 – 2016-05-26 (×2): 0.5 mg via ORAL
  Filled 2016-05-25 (×2): qty 1

## 2016-05-25 MED ORDER — FOLIC ACID 1 MG PO TABS
1.0000 mg | ORAL_TABLET | Freq: Every day | ORAL | Status: DC
Start: 2016-05-25 — End: 2016-05-25

## 2016-05-25 MED ORDER — ADULT MULTIVITAMIN W/MINERALS CH
1.0000 | ORAL_TABLET | Freq: Every day | ORAL | Status: DC
  Administered 2016-05-26: 1 via ORAL
  Filled 2016-05-25: qty 1

## 2016-05-25 MED ORDER — LORAZEPAM 2 MG/ML IJ SOLN
1.0000 mg | Freq: Four times a day (QID) | INTRAMUSCULAR | Status: DC | PRN
Start: 2016-05-25 — End: 2016-05-26

## 2016-05-25 MED ORDER — LORAZEPAM 1 MG PO TABS
1.0000 mg | ORAL_TABLET | Freq: Four times a day (QID) | ORAL | Status: DC | PRN
  Administered 2016-05-25 (×2): 1 mg via ORAL
  Filled 2016-05-25 (×2): qty 1

## 2016-05-25 NOTE — Plan of Care (Signed)
Problem: Education: Goal: Knowledge of [facility] Education information/materials will improve Outcome: Progressing Educated patient about possible need for [employer] line

## 2016-05-25 NOTE — Progress Notes (Signed)
PROGRESS NOTE    Noah [name]  ZHY:[id number] DOB: 06/04/1980 DOA: 05/22/2016 PCP: Pcp Not In System     Brief Narrative:  Noah [name] is a 36 y.o.M with a history of asthma, polysubstance abuse including IV drug abuse, and EtOH dependence who presented to the ED for evaluation of new onset seizure activity with intractable headache, fever, neck stiffness. Patient received a total of 6mg  of IV ativan in the ED for seizure activity, then combativeness. Patient had camped outside with his children the night prior, but had no reported bites or rashes. 9AM the morning of his presentation he began to c/o intractable headache.He did not want to come to the hospital but ultimately was found down drooling from the side of his mouth. While attending to him his wife witnessed convulsions, followed by what appeared to be a post-ictal phase.   The patient required assisted ventilations en route to the ED (but no chest compressions). He had recurrent seizure activity in the ED. The patient received IV decadron, rocephin, and vancomycin. LP  completed by interventional radiology. Neurology consulted.    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 Restart low-dose Xanax   Elevated liver enzymes -Continue to trend LFT, AST greater than ALT Patient undermines how much he drinks, states that he drinks heavily on the weekends only Will start patient on CIWA protocol Will order right upper quadrant ultrasound and hepatitis panel  Anxiety -Xanax , This will be resumed   DVT prophylaxis: SCDs Code Status: Full Family Communication: at bedside Disposition Plan:  Continue to monitor  for further seizures, alcohol withdrawal   Consultants:   Neurology   Procedures:   LP   Antimicrobials:   Acyclovir 3/20 >  Ceftriaxone 3/20 >  Vancomycin 3/20 >   Subjective: Afebrile, denies any headache, blurry vision, No acute events overnight.  Objective: Vitals:   05/24/16 2017 05/25/16 0005 05/25/16 0410 05/25/16 0700  BP: 103/69 122/72 94/61   Pulse: 86 82 (!) 59   Resp: (!) 22 16 (!) 21   Temp: 99.3 F (37.4 C) 98.5 F (36.9 C) 97.6 F (36.4 C) 98.6 F (37 C)  TempSrc: Oral Oral Oral Oral  SpO2: 98% 99% 100%   Weight:      Height:        Intake/Output Summary (Last 24 hours) at 05/25/16 1036 Last data filed at 05/25/16 0900  Gross per 24 hour  Intake           1191.2 ml  Output  1150 ml  Net             41.2 ml   Filed Weights   05/22/16 1647 05/23/16 0527  Weight: 77.6 kg (171 lb) 57.8 kg (127 lb 6.8 oz)    Examination:  General exam: Appears calm and comfortable  Respiratory system: Clear to auscultation. Respiratory effort normal. Cardiovascular system: S1 & S2 heard, RRR. No JVD, murmurs, rubs, gallops or clicks. No pedal edema. Gastrointestinal system: Abdomen  is nondistended, soft and nontender. No organomegaly or masses felt. Normal bowel sounds heard. Central nervous system: Alert and oriented. No focal neurological deficits. Extremities: Symmetric 5 x 5 power. Skin: No rashes, lesions or ulcers Psychiatry: Judgement and insight appear normal. Mood & affect appropriate.   Data Reviewed: I have personally reviewed following labs and imaging studies  CBC:  Recent Labs Lab 05/22/16 1715 05/23/16 0142 05/24/16 0405 05/25/16 0212  WBC 18.3* 13.9* 10.3 6.6  NEUTROABS 16.2*  --   --   --   HGB 12.9* 11.8* 12.0* 11.4*  HCT 38.6* 35.4* 36.7* 34.5*  MCV 86.9 86.8 88.2 87.3  PLT 267 135* 236 246   Basic Metabolic Panel:  Recent Labs Lab 05/22/16 1715 05/23/16 0142 05/24/16 0405 05/25/16 0212  NA 137 135 137 137  K 4.3 3.9 3.8 3.9  CL 102 104 102 102  CO2 21* 19* 23 26  GLUCOSE 98 118* 85 104*  BUN 9 6 8 6   CREATININE 1.00 0.95 0.87 0.77  CALCIUM 9.8 8.6* 8.7* 8.6*   GFR: Estimated Creatinine Clearance: 106.4 mL/min (by C-G formula based on SCr of 0.77 mg/dL). Liver Function Tests:  Recent Labs Lab 05/22/16 1715 05/24/16 0405 05/25/16 0212  AST 75* 487* 535*  ALT 62 69* 70*  ALKPHOS 68 48 47  BILITOT 0.6 0.5 0.4  PROT 8.7* 6.7 6.3*  ALBUMIN 4.5 3.3* 2.9*    Recent Labs Lab 05/22/16 1715  LIPASE <10*   No results for input(s): AMMONIA in the last 168 hours. Coagulation Profile:  Recent Labs Lab 05/22/16 1715  INR 1.04   Cardiac Enzymes: No results for input(s): CKTOTAL, CKMB, CKMBINDEX, TROPONINI in the last 168 hours. BNP (last 3 results) No results for input(s): PROBNP in the last 8760 hours. HbA1C: No results for input(s): HGBA1C in the last 72 hours. CBG: No results for input(s): GLUCAP in the last 168 hours. Lipid Profile: No results for input(s): CHOL, HDL, LDLCALC, TRIG, CHOLHDL, LDLDIRECT in the last 72 hours. Thyroid Function Tests: No results for input(s): TSH, T4TOTAL, FREET4, T3FREE,  THYROIDAB in the last 72 hours. Anemia Panel: No results for input(s): VITAMINB12, FOLATE, FERRITIN, TIBC, IRON, RETICCTPCT in the last 72 hours. Sepsis Labs:  Recent Labs Lab 05/22/16 1725 05/22/16 2237  LATICACIDVEN 3.16* 1.68    Recent Results (from the past 240 hour(s))  Blood culture (routine x 2)     Status: None (Preliminary result)   Collection Time: 05/22/16  5:15 PM  Result Value Ref Range Status   Specimen Description BLOOD LEFT ANTECUBITAL  Final   Special Requests IN PEDIATRIC BOTTLE 3CC  Final   Culture NO GROWTH 2 DAYS  Final   Report Status PENDING  Incomplete  Urine culture     Status: None   Collection Time: 05/22/16  5:51 PM  Result Value Ref Range Status   Specimen Description URINE, RANDOM  Final   Special Requests NONE  Final   Culture NO GROWTH  Final   Report Status 05/23/2016 FINAL  Final  Blood culture (routine x 2)     Status: None (Preliminary result)   Collection Time: 05/22/16 10:25 PM  Result Value Ref Range Status   Specimen Description BLOOD LEFT WRIST  Final   Special Requests AEROBIC BOTTLE ONLY  Final   Culture NO GROWTH 1 DAY  Final   Report Status PENDING  Incomplete  MRSA PCR Screening     Status: None   Collection Time: 05/23/16  6:20 AM  Result Value Ref Range Status   MRSA by PCR NEGATIVE NEGATIVE Final    Comment:        The GeneXpert MRSA Assay (FDA approved for NASAL specimens only), is one component of a comprehensive MRSA colonization surveillance program. It is not intended to diagnose MRSA infection nor to guide or monitor treatment for MRSA infections.   CSF culture     Status: None (Preliminary result)   Collection Time: 05/24/16  1:15 PM  Result Value Ref Range Status   Specimen Description CSF  Final   Special Requests TUBE 2  Final   Gram Stain   Final    WBC PRESENT, PREDOMINANTLY PMN NO ORGANISMS SEEN CYTOSPIN SMEAR    Culture PENDING  Incomplete   Report Status PENDING  Incomplete        Radiology Studies: Mr Laqueta Jean Wo Contrast  Result Date: 05/24/2016 CLINICAL DATA:  Possible seizure. History of IV drug abuse. Headache, fever and neck stiffness. EXAM: MRI HEAD WITHOUT AND WITH CONTRAST TECHNIQUE: Multiplanar, multiecho pulse sequences of the brain and surrounding structures were obtained without and with intravenous contrast. CONTRAST:  10mL MULTIHANCE GADOBENATE DIMEGLUMINE 529 MG/ML IV SOLN COMPARISON:  Head CT 05/22/2016 FINDINGS: Despite efforts by the technologist and patient, motion artifact is present on today's examination and could not be eliminated. The findings of this study are interpreted in the context of that limitation. Brain: No focal diffusion restriction to indicate acute infarct. No intraparenchymal hemorrhage. The brain parenchymal signal is normal. No mass lesion or midline shift. No hydrocephalus or extra-axial fluid collection. The midline structures are normal. No age advanced or lobar predominant atrophy. Vascular: Major intracranial arterial and venous sinus flow voids are preserved. No evidence of chronic microhemorrhage or amyloid angiopathy. Skull and upper cervical spine: The visualized skull base, calvarium, upper cervical spine and extracranial soft tissues are normal. Sinuses/Orbits: No fluid levels or advanced mucosal thickening. No mastoid effusion. Normal orbits. IMPRESSION: Normal MRI of the brain. Electronically Signed   By: Deatra Robinson M.D.   On: 05/24/2016 03:54   Dg Fluoro Guided Needle Plc Aspiration/injection Loc  Result Date: 05/24/2016 CLINICAL DATA:  Meningoencephalitis. EXAM: DIAGNOSTIC LUMBAR PUNCTURE UNDER FLUOROSCOPIC GUIDANCE FLUOROSCOPY TIME:  Fluoroscopy Time:  18 seconds Radiation Exposure Index (if provided by the fluoroscopic device): 4.4 mGy Number of Acquired Spot Images: 0 PROCEDURE: Informed consent was obtained from the patient prior to the procedure, including potential complications of headache, allergy, and pain.  With the patient prone, the lower back was prepped with Betadine. 1% Lidocaine was used for local anesthesia. Lumbar puncture was performed at the L3-4 level using a 20 gauge needle with return of clear CSF with an opening pressure of 13 cm water. 12 ml of CSF were obtained for laboratory studies. The patient tolerated the procedure well and there were no apparent complications. IMPRESSION: Technically successful lumbar puncture under fluoroscopic guidance. Electronically Signed   By: Charlett Nose M.D.   On: 05/24/2016 13:32      Scheduled Meds: . folic  acid  1 mg Oral Daily  . multivitamin with minerals  1 tablet Oral Daily  . nicotine  7 mg Transdermal Daily  . sodium chloride flush  3 mL Intravenous Q12H  . thiamine  100 mg Oral Daily   Or  . thiamine  100 mg Intravenous Daily   Continuous Infusions: . sodium chloride 10 mL/hr at 05/24/16 0912     LOS: 2 days    Time spent: 40 minutes   Richarda Overlie, MD [facility] [email or web address] Password [facility] 05/25/2016, 10:36 AM

## 2016-05-25 NOTE — Progress Notes (Signed)
Neurology Progress Note  Subjective:  Doing well today, no complaints. Denies headache, neck pain. He feels that he is back to his usual baseline. No new concerns. He had his LP under fluoro yesterday, no complications. PICC line inserted this morning. 12-point ROS negative.   Medications reviewed and reconciled.   Pertinent meds: Acyclovir 580 mg IV every 8 hours Ceftriaxone 2 g IV every 12 hours Vancomycin 1000 g IV every 8 hours Keppra 500 mg IV every 12 hours  Current Meds:   Current Facility-Administered Medications:  .  0.9 %  sodium chloride infusion, , Intravenous, Continuous, Lonia Blood, MD, Last Rate: 10 mL/hr at 05/24/16 0912 .  acetaminophen (TYLENOL) tablet 650 mg, 650 mg, Oral, Q6H PRN **OR** acetaminophen (TYLENOL) suppository 650 mg, 650 mg, Rectal, Q6H PRN, Michael Litter, MD .  acyclovir (ZOVIRAX) 580 mg in dextrose 5 % 100 mL IVPB, 10 mg/kg, Intravenous, Q8H, Bertram Millard, RPH, 580 mg at 05/25/16 0600 .  ALPRAZolam (XANAX) tablet 0.5 mg, 0.5 mg, Oral, TID PRN, Lonia Blood, MD, 0.5 mg at 05/25/16 0900 .  cefTRIAXone (ROCEPHIN) 2 g in dextrose 5 % 50 mL IVPB, 2 g, Intravenous, Q12H, Michael Litter, MD, 2 g at 05/24/16 2037 .  folic acid (FOLVITE) tablet 1 mg, 1 mg, Oral, Daily, Michael Litter, MD, 1 mg at 05/25/16 0900 .  levETIRAcetam (KEPPRA) 500 mg in sodium chloride 0.9 % 100 mL IVPB, 500 mg, Intravenous, Q12H, Michael Litter, MD, 500 mg at 05/24/16 2258 .  multivitamin with minerals tablet 1 tablet, 1 tablet, Oral, Daily, Michael Litter, MD, 1 tablet at 05/25/16 0859 .  nicotine (NICODERM CQ - dosed in mg/24 hr) patch 7 mg, 7 mg, Transdermal, Daily, Lonia Blood, MD, 7 mg at 05/25/16 0901 .  ondansetron (ZOFRAN) tablet 4 mg, 4 mg, Oral, Q6H PRN **OR** ondansetron (ZOFRAN) injection 4 mg, 4 mg, Intravenous, Q6H PRN, Michael Litter, MD .  sodium chloride flush (NS) 0.9 % injection 10-40 mL, 10-40 mL, Intracatheter, PRN, Richarda Overlie, MD .  sodium chloride flush  (NS) 0.9 % injection 3 mL, 3 mL, Intravenous, Q12H, Michael Litter, MD, 3 mL at 05/25/16 0901 .  thiamine (VITAMIN B-1) tablet 100 mg, 100 mg, Oral, Daily, 100 mg at 05/25/16 0859 **OR** thiamine (B-1) injection 100 mg, 100 mg, Intravenous, Daily, Michael Litter, MD .  vancomycin (VANCOCIN) IVPB 1000 mg/200 mL premix, 1,000 mg, Intravenous, Q8H, Mahala Menghini, RPH, Last Rate: 200 mL/hr at 05/25/16 0200, 1,000 mg at 05/25/16 0200  Objective:  Temp:  [97.6 F (36.4 C)-99.3 F (37.4 C)] 98.6 F (37 C) (03/23 0700) Pulse Rate:  [59-108] 59 (03/23 0410) Resp:  [16-22] 21 (03/23 0410) BP: (94-123)/(51-74) 94/61 (03/23 0410) SpO2:  [98 %-100 %] 100 % (03/23 0410)  General: WDWN AA male lying in bed. He is alert and appropriate. Speech is clear, no dysarthria. No aphasia. Affect is bright. Comportment is normal.  HEENT: Neck is supple without lymphadenopathy. Mucous membranes are moist and the oropharynx is clear. Sclerae are anicteric. There is no conjunctival injection.  CV: Regular, no murmur.  Distal pulses 2+ and symmetric.  Lungs: CTAB   Neuro: MS: As noted above.  CN: Pupils are equal and reactive from 3-->2 mm bilaterally. Visual fields are full. EOM intact, no nystagmus. Facial sensation is intact to light touch. His face is symmetric at rest with normal strength and mobility. Hearing appears to be intact to conversational voice. His palate elevates symmetrically and uvula is midline.  B SCM and trapezius strength is 5/5. Tongue midline.  Motor: Normal bulk, tone and strength throughout. No tremor or other abnormal movements are observed. No drift.  Sensation: Appears intact to light touch.  DTRs: 2+, symmetric. Toes are downgoing bilaterally. No pathological reflexes.  Coordination: FTN without dysmetria.   Labs: Lab Results  Component Value Date   WBC 6.6 05/25/2016   HGB 11.4 (L) 05/25/2016   HCT 34.5 (L) 05/25/2016   PLT 246 05/25/2016   GLUCOSE 104 (H) 05/25/2016   ALT 70 (H)  05/25/2016   AST 535 (H) 05/25/2016   NA 137 05/25/2016   K 3.9 05/25/2016   CL 102 05/25/2016   CREATININE 0.77 05/25/2016   BUN 6 05/25/2016   CO2 26 05/25/2016   INR 1.04 05/22/2016   CBC Latest Ref Rng & Units 05/25/2016 05/24/2016 05/23/2016  WBC 4.0 - 10.5 K/uL 6.6 10.3 13.9(H)  Hemoglobin 13.0 - 17.0 g/dL 11.4(L) 12.0(L) 11.8(L)  Hematocrit 39.0 - 52.0 % 34.5(L) 36.7(L) 35.4(L)  Platelets 150 - 400 K/uL 246 236 135(L)    No results found for: HGBA1C Lab Results  Component Value Date   ALT 70 (H) 05/25/2016   AST 535 (H) 05/25/2016   ALKPHOS 47 05/25/2016   BILITOT 0.4 05/25/2016   AST 75-->487-->535 ALT 62-->69-->70 HIV nonreactive  CSF WBCs 1-->1 CSF RBCs 63-->1 CSF protein 23 CSF glucose 60 CSF gram stain negative  CSF cx pending CSF HSV PCR pending   Blood cultures NGTD 2  Radiology:  There is no new neuroimaging.  A/P:   1. 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 However, CSF does not support this. Even though he had been on antibiotics for greater than 36 hours by the time the LP was done, I would still expect to see some pleocytosis and elevated protein if he was infected. I am stopping his antimicrobials at this time. He no longer requires droplet precautions for this.    2. Seizure: This was a first-time seizure. CSF excludes infection as above. The patient reports that he ran out of Xanax several days prior to his presentation--he was taking this three times daily until he ran out. His seizure could be reflective of benzo withdrawal. I will stop his Keppra as this appears to have been a provoked seizure.  3. Acute encephalopathy: This has improved. This was most likely multifactorial in etiology with contributions from, seizure with postictal state, metabolic derangement (AST continues to rise) and medication effect. Continue to optimize metabolic status as necessary. Continue antibiotics for presumed infection.  Limit CNS active medications, particularly benzodiazepines, opiates, and anything with strong anticholinergic properties. Optimize circadian rhythms by keeping the room bright and active during the day, dark and quiet at night.  4. Transaminitis: He has steadily increasing LFTs. This has been reported with Keppra and I will stop this today given that his seizure was likely provoked. I will defer further evaluation to primary.   Needs outpatient neurology follow-up?: Can f/u with PCP. Consider neuro referral should he have unprovoked seizure in the future.   This was discussed with the patient. Education was provided on the diagnosis and expected evaluation and treatment. He is in agreement with the plan as noted. He was given the opportunity to ask any questions and these were addressed to his satisfaction.   I have no additional recommendations and will sign off. Please call if any new issues arise.   Rhona Leavensimothy Oster, MD [facility]

## 2016-05-25 NOTE — Progress Notes (Signed)
Peripherally Inserted Central Catheter/Midline Placement  The IV Nurse has discussed with the patient and/or persons authorized to consent for the patient, the purpose of this procedure and the potential benefits and risks involved with this procedure.  The benefits include less needle sticks, lab draws from the catheter, and the patient may be discharged home with the catheter. Risks include, but not limited to, infection, bleeding, blood clot (thrombus formation), and puncture of an artery; nerve damage and irregular heartbeat and possibility to perform a PICC exchange if needed/ordered by physician.  Alternatives to this procedure were also discussed.  B[employer] PICC patient education guide, fact sheet on infection prevention and patient information card has been provided to patient /or left at bedside.    PICC/Midline Placement Documentation        Noah [name], Noah [name] 05/25/2016, 10:03 AM

## 2016-05-26 ENCOUNTER — Inpatient Hospital Stay (HOSPITAL_COMMUNITY): Payer: Medicaid Other

## 2016-05-26 LAB — COMPREHENSIVE METABOLIC PANEL
ALK PHOS: 49 U/L (ref 38–126)
ALT: 86 U/L — AB (ref 17–63)
AST: 575 U/L — ABNORMAL HIGH (ref 15–41)
Albumin: 2.9 g/dL — ABNORMAL LOW (ref 3.5–5.0)
Anion gap: 8 (ref 5–15)
BUN: 8 mg/dL (ref 6–20)
CO2: 30 mmol/L (ref 22–32)
CREATININE: 0.7 mg/dL (ref 0.61–1.24)
Calcium: 9.2 mg/dL (ref 8.9–10.3)
Chloride: 100 mmol/L — ABNORMAL LOW (ref 101–111)
GFR calc non Af Amer: >60 mL/min (ref >60)
GLUCOSE: 96 mg/dL (ref 65–99)
Potassium: 3.7 mmol/L (ref 3.5–5.1)
SODIUM: 138 mmol/L (ref 135–145)
Total Bilirubin: 0.4 mg/dL (ref 0.3–1.2)
Total Protein: 6.8 g/dL (ref 6.5–8.1)

## 2016-05-26 LAB — HEPATITIS PANEL, ACUTE
HCV Ab: >11 {s_co_ratio} — ABNORMAL HIGH (ref 0.0–0.9)
Hep A IgM: NEGATIVE
Hep B C IgM: NEGATIVE
Hepatitis B Surface Ag: NEGATIVE

## 2016-05-26 LAB — PROTIME-INR
INR: 0.97
Prothrombin Time: 12.8 seconds (ref 11.4–15.2)

## 2016-05-26 LAB — HIV ANTIBODY (ROUTINE TESTING W REFLEX): HIV SCREEN 4TH GENERATION: NONREACTIVE

## 2016-05-26 MED ORDER — SODIUM CHLORIDE 0.9% FLUSH: 10.0000 mL | INTRAVENOUS | Status: DC | PRN

## 2016-05-26 MED ORDER — VITAMIN B-1 100 MG PO TABS
100.0000 mg | ORAL_TABLET | Freq: Every day | ORAL | Status: DC
  Administered 2016-05-26: 100 mg via ORAL
  Filled 2016-05-26: qty 1

## 2016-05-26 MED ORDER — ALPRAZOLAM 0.5 MG PO TABS: 0.5000 mg | ORAL_TABLET | Freq: Two times a day (BID) | ORAL | 0 refills | Status: DC

## 2016-05-26 MED ORDER — SODIUM CHLORIDE 0.9% FLUSH
10.0000 mL | Freq: Two times a day (BID) | INTRAVENOUS | Status: DC
  Administered 2016-05-26: 10 mL

## 2016-05-26 MED ORDER — THIAMINE HCL 100 MG/ML IJ SOLN: 100.0000 mg | Freq: Every day | INTRAMUSCULAR | Status: DC

## 2016-05-26 NOTE — Progress Notes (Signed)
Discharge instructions given to patient, all questions answered at this time.  Pt. VSS with no s/s of distress noted.  Prescription given to patient.  Pt. Stable at discharge.

## 2016-05-26 NOTE — Discharge Summary (Signed)
Physician Discharge Summary  Steadman Prosperi NWG:[id number] DOB: 07/21/1980 DOA: 05/22/2016  PCP: Pcp Not In System, has a PCP in  but does not recall his name   Admit date: 05/22/2016 Discharge date: 05/26/2016  Admitted From: Home Disposition:  Home  Recommendations for Outpatient Follow-up:  1. Follow up with PCP in 1 week 2. Please obtain LFT in 1 week  3. You may not drive 6 months after seizure episode 4. Please follow up on the following pending results: hepatitis C RNA 5. Will need follow up on RUQ US findings of small hyperechoic superior liver lesion, possibly small hemangioma vs other. Due to new dx of hepatitis C, he will need further work up by GI. Will defer to PCP for GI referral as outpatient.   Home Health: No  Equipment/Devices: None   Discharge Condition: Stable CODE STATUS: Full  Diet recommendation: General   Brief/Interim Summary: Noah [name] is a 36 y.o.Mwith a history of asthma, polysubstance abuse including IV drug abuse, and EtOH dependence who presented to the ED for evaluation of new onset seizure activity with intractable headache, fever, neck stiffness. Patient receiveda total of 6mg  of IV ativan in the ED for seizure activity, then combativeness. Patient had camped outside with his children the night prior, but had no reported bites or rashes. 9AM the morning of his presentation he began to c/o intractable headache.He did not want to come to the hospital but ultimately was found down drooling from the side of his mouth. While attending to him his wife witnessed convulsions, followed by what appeared to be a post-ictal phase. EMS was called. The patient required assisted ventilations en route to the ED (but no chest compressions). He had recurrent seizure activity in the ED. The patient received IV decadron, rocephin, and vancomycin. LP attempt by the ED attending was unsuccessful. Neurology consulted. Patient ultimately underwent LP by IR. CFS was  not consistent with infectious etiology. Antibiotics as well as antiviral were stopped. Etiology of his seizure was that it could have reflected benzodiazepine withdrawal (he was taking up to 3 xanax daily then stopped abruptly). Patient's Keppra was stopped as this could have been a provoked seizure. Workup revealed elevated LFT. Hepatitis panel was positive for hepatitis C. This was discussed with patient. Right upper quadrant ultrasound was obtained which showed a small hyperechoic superior liver lesion, possibly small hemangioma versus other. He is encouraged to follow up with his primary care physician; will need an outpatient GI referral for further treatment and evaluation of hepatitis C. Patient also educated that he may not drive 6 months post seizure episode. He did not have any seizure episodes while in hospital  Discharge Diagnoses:  Active Problems:   Fever   Headache   Meningitis   Polysubstance abuse   EtOH dependence [facility])  Seizure  -Neurology consulted. Initial concern for meningoencephalitis, LP was negative. This could have been a provoked seizure from benzodiazepine withdrawal. Patient educated that he may not drive 6 months post seizure episode. If seizure recurs, he needs to follow up with outpatient neurology  Concern for meningoencephalitis  -LP in emergency department was unsuccessful. -LP with fluoroscopy  -MRI brain negative  -CSF inconsistent with infectious etiology -Empiric vancomycin, Rocephin, acyclovir stopped  Acute encephalopathy -Multifactorial including possible CNS infection, seizure, metabolic -Resolved  Elevated liver enzymes hepatitis C -Patient with no previous diagnosis, but does have history of IV drug abuse. -HCV RNA with reflex genotype is pending at time of discharge -Right upper quadrant ultrasound as  below -Needs outpatient GI referral and follow-up  Anxiety -Xanax    Discharge Instructions  Discharge Instructions    Call MD  for:    Complete by:  As directed    Recurrent seizure episode   Diet general    Complete by:  As directed    Discharge instructions    Complete by:  As directed    Recommendations for Outpatient Follow-up:  Follow up with PCP in 1 week Please obtain LFT in 1 week  Please follow up on the following pending results: hepatitis C RNA level Will need follow up on RUQ US findings of small hyperechoic superior liver lesion, possibly small hemangioma vs other. Due to new diagnosis of hepatitis C, he will need further work up by GI. Will defer to PCP for GI referral as outpatient.   Driving Restrictions    Complete by:  As directed    You may not drive for 6 months after a seizure episode   Increase activity slowly    Complete by:  As directed      Allergies as of 05/26/2016   No Known Allergies     Medication List    TAKE these medications   ALPRAZolam 0.5 MG tablet Commonly known as:  XANAX Take 1 tablet (0.5 mg total) by mouth 2 (two) times daily.      Follow-up Information    Your PCP in [place]. Schedule an appointment as soon as possible for a visit in 1 week(s).   Why:  You must follow up regarding your hepatitis C, hepatitis C RNA levels which are pending, and repeat labs including liver function enzymes in 1 week. Follow up for xanax dose and prescriptions.          No Known Allergies  Consultations:  Neurology  IR   Procedures/Studies: Ct Head Wo Contrast  Result Date: 05/22/2016 CLINICAL DATA:  Syncope, headache, possible seizure activity. EXAM: CT HEAD WITHOUT CONTRAST TECHNIQUE: Contiguous axial images were obtained from the base of the skull through the vertex without intravenous contrast. COMPARISON:  02/28/2012 FINDINGS: Limited exam with some motion artifact. Brain: No evidence of acute infarction, hemorrhage, hydrocephalus, extra-axial collection or mass lesion/mass effect. Vascular: No hyperdense vessel or unexpected calcification. Skull: Normal.  Negative for fracture or focal lesion. Sinuses/Orbits: No acute finding. Other: None. IMPRESSION: Normal head CT without contrast. Limited study with motion artifact. Electronically Signed   By: Judie Petit.  Shick M.D.   On: 05/22/2016 19:09   Mr Laqueta Jean ZO Contrast  Result Date: 05/24/2016 CLINICAL DATA:  Possible seizure. History of IV drug abuse. Headache, fever and neck stiffness. EXAM: MRI HEAD WITHOUT AND WITH CONTRAST TECHNIQUE: Multiplanar, multiecho pulse sequences of the brain and surrounding structures were obtained without and with intravenous contrast. CONTRAST:  10mL MULTIHANCE GADOBENATE DIMEGLUMINE 529 MG/ML IV SOLN COMPARISON:  Head CT 05/22/2016 FINDINGS: Despite efforts by the technologist and patient, motion artifact is present on today's examination and could not be eliminated. The findings of this study are interpreted in the context of that limitation. Brain: No focal diffusion restriction to indicate acute infarct. No intraparenchymal hemorrhage. The brain parenchymal signal is normal. No mass lesion or midline shift. No hydrocephalus or extra-axial fluid collection. The midline structures are normal. No age advanced or lobar predominant atrophy. Vascular: Major intracranial arterial and venous sinus flow voids are preserved. No evidence of chronic microhemorrhage or amyloid angiopathy. Skull and upper cervical spine: The visualized skull base, calvarium, upper cervical spine and extracranial soft tissues  are normal. Sinuses/Orbits: No fluid levels or advanced mucosal thickening. No mastoid effusion. Normal orbits. IMPRESSION: Normal MRI of the brain. Electronically Signed   By: Deatra Robinson M.D.   On: 05/24/2016 03:54   Dg Chest Portable 1 View  Result Date: 05/22/2016 CLINICAL DATA:  Seizure, headache, suspect meningitis EXAM: PORTABLE CHEST 1 VIEW COMPARISON:  02/28/2012 FINDINGS: Cardiomediastinal silhouette is stable. No infiltrate or pleural effusion. No pulmonary edema. Bony thorax is  unremarkable. IMPRESSION: No active disease. Electronically Signed   By: Natasha Mead M.D.   On: 05/22/2016 17:25   Dg Fluoro Guided Needle Plc Aspiration/injection Loc  Result Date: 05/24/2016 CLINICAL DATA:  Meningoencephalitis. EXAM: DIAGNOSTIC LUMBAR PUNCTURE UNDER FLUOROSCOPIC GUIDANCE FLUOROSCOPY TIME:  Fluoroscopy Time:  18 seconds Radiation Exposure Index (if provided by the fluoroscopic device): 4.4 mGy Number of Acquired Spot Images: 0 PROCEDURE: Informed consent was obtained from the patient prior to the procedure, including potential complications of headache, allergy, and pain. With the patient prone, the lower back was prepped with Betadine. 1% Lidocaine was used for local anesthesia. Lumbar puncture was performed at the L3-4 level using a 20 gauge needle with return of clear CSF with an opening pressure of 13 cm water. 12 ml of CSF were obtained for laboratory studies. The patient tolerated the procedure well and there were no apparent complications. IMPRESSION: Technically successful lumbar puncture under fluoroscopic guidance. Electronically Signed   By: Charlett Nose M.D.   On: 05/24/2016 13:32   US Abdomen Limited Ruq  Result Date: 05/26/2016 CLINICAL DATA:  Inpatient.  Transaminitis.  Encephalopathy. EXAM: US ABDOMEN LIMITED - RIGHT UPPER QUADRANT COMPARISON:  12/02/2012 CT abdomen/ pelvis. FINDINGS: Gallbladder: No gallstones or wall thickening visualized. No sonographic Murphy sign noted by sonographer. Common bile duct: Diameter: 3 mm Liver: Liver parenchymal echogenicity and echotexture appear normal. Questionable hyperechoic 1.0 x 0.7 x 0.8 cm superior liver lesion. No additional liver lesions. IMPRESSION: 1. Normal gallbladder, with no cholelithiasis. No biliary ductal dilatation. 2. Questionable solitary small hyperechoic superior liver lesion, which is indeterminate and statistically most likely to represent a small hemangioma. Otherwise normal liver. Unless the patient has risk  factors for liver malignancy, follow-up outpatient MRI abdomen without and with IV contrast can be performed in 3-6 months for further characterization. Electronically Signed   By: Delbert Phenix M.D.   On: 05/26/2016 09:55      Discharge Exam: Vitals:   05/26/16 0849 05/26/16 1100  BP: 105/69 111/61  Pulse: 78 85  Resp: 18 (!) 21  Temp: 97.8 F (36.6 C) 98.4 F (36.9 C)   Vitals:   05/25/16 2338 05/26/16 0343 05/26/16 0849 05/26/16 1100  BP: 110/75 (!) 101/55 105/69 111/61  Pulse: 85 71 78 85  Resp: (!) 25 19 18  (!) 21  Temp: 98.5 F (36.9 C) 97.9 F (36.6 C) 97.8 F (36.6 C) 98.4 F (36.9 C)  TempSrc: Oral Oral Oral Oral  SpO2: 100% 100% 98% 98%  Weight:      Height:       General: Pt is alert, awake, not in acute distress Cardiovascular: RRR, S1/S2 +, no rubs, no gallops Respiratory: CTA bilaterally, no wheezing, no rhonchi Abdominal: Soft, NT, ND, bowel sounds + Extremities: no edema, no cyanosis Neuro: awake, alert, appropriate, speech fluent, nonfocal     The results of significant diagnostics from this hospitalization (including imaging, microbiology, ancillary and laboratory) are listed below for reference.     Microbiology: Recent Results (from the past 240 hour(s))  Blood  culture (routine x 2)     Status: None (Preliminary result)   Collection Time: 05/22/16  5:15 PM  Result Value Ref Range Status   Specimen Description BLOOD LEFT ANTECUBITAL  Final   Special Requests IN PEDIATRIC BOTTLE 3CC  Final   Culture NO GROWTH 4 DAYS  Final   Report Status PENDING  Incomplete  Urine culture     Status: None   Collection Time: 05/22/16  5:51 PM  Result Value Ref Range Status   Specimen Description URINE, RANDOM  Final   Special Requests NONE  Final   Culture NO GROWTH  Final   Report Status 05/23/2016 FINAL  Final  Blood culture (routine x 2)     Status: None (Preliminary result)   Collection Time: 05/22/16 10:25 PM  Result Value Ref Range Status   Specimen  Description BLOOD LEFT WRIST  Final   Special Requests AEROBIC BOTTLE ONLY  Final   Culture NO GROWTH 3 DAYS  Final   Report Status PENDING  Incomplete  MRSA PCR Screening     Status: None   Collection Time: 05/23/16  6:20 AM  Result Value Ref Range Status   MRSA by PCR NEGATIVE NEGATIVE Final    Comment:        The GeneXpert MRSA Assay (FDA approved for NASAL specimens only), is one component of a comprehensive MRSA colonization surveillance program. It is not intended to diagnose MRSA infection nor to guide or monitor treatment for MRSA infections.   CSF culture     Status: None (Preliminary result)   Collection Time: 05/24/16  1:15 PM  Result Value Ref Range Status   Specimen Description CSF  Final   Special Requests TUBE 2  Final   Gram Stain   Final    WBC PRESENT, PREDOMINANTLY PMN NO ORGANISMS SEEN CYTOSPIN SMEAR    Culture NO GROWTH 2 DAYS  Final   Report Status PENDING  Incomplete     Labs: BNP (last 3 results) No results for input(s): BNP in the last 8760 hours. Basic Metabolic Panel:  Recent Labs Lab 05/22/16 1715 05/23/16 0142 05/24/16 0405 05/25/16 0212 05/26/16 0423  NA 137 135 137 137 138  K 4.3 3.9 3.8 3.9 3.7  CL 102 104 102 102 100*  CO2 21* 19* 23 26 30   GLUCOSE 98 118* 85 104* 96  BUN 9 6 8 6 8   CREATININE 1.00 0.95 0.87 0.77 0.70  CALCIUM 9.8 8.6* 8.7* 8.6* 9.2   Liver Function Tests:  Recent Labs Lab 05/22/16 1715 05/24/16 0405 05/25/16 0212 05/26/16 0423  AST 75* 487* 535* 575*  ALT 62 69* 70* 86*  ALKPHOS 68 48 47 49  BILITOT 0.6 0.5 0.4 0.4  PROT 8.7* 6.7 6.3* 6.8  ALBUMIN 4.5 3.3* 2.9* 2.9*    Recent Labs Lab 05/22/16 1715  LIPASE <10*   No results for input(s): AMMONIA in the last 168 hours. CBC:  Recent Labs Lab 05/22/16 1715 05/23/16 0142 05/24/16 0405 05/25/16 0212  WBC 18.3* 13.9* 10.3 6.6  NEUTROABS 16.2*  --   --   --   HGB 12.9* 11.8* 12.0* 11.4*  HCT 38.6* 35.4* 36.7* 34.5*  MCV 86.9 86.8  88.2 87.3  PLT 267 135* 236 246   Cardiac Enzymes: No results for input(s): CKTOTAL, CKMB, CKMBINDEX, TROPONINI in the last 168 hours. BNP: Invalid input(s): POCBNP CBG: No results for input(s): GLUCAP in the last 168 hours. D-Dimer No results for input(s): DDIMER in the last 72  hours. Hgb A1c No results for input(s): HGBA1C in the last 72 hours. Lipid Profile No results for input(s): CHOL, HDL, LDLCALC, TRIG, CHOLHDL, LDLDIRECT in the last 72 hours. Thyroid function studies No results for input(s): TSH, T4TOTAL, T3FREE, THYROIDAB in the last 72 hours.  Invalid input(s): FREET3 Anemia work up No results for input(s): VITAMINB12, FOLATE, FERRITIN, TIBC, IRON, RETICCTPCT in the last 72 hours. Urinalysis    Component Value Date/Time   COLORURINE STRAW (A) 05/22/2016 1751   APPEARANCEUR CLOUDY (A) 05/22/2016 1751   LABSPEC 1.015 05/22/2016 1751   PHURINE 5.0 05/22/2016 1751   GLUCOSEU NEGATIVE 05/22/2016 1751   HGBUR MODERATE (A) 05/22/2016 1751   BILIRUBINUR NEGATIVE 05/22/2016 1751   KETONESUR 5 (A) 05/22/2016 1751   PROTEINUR 30 (A) 05/22/2016 1751   NITRITE NEGATIVE 05/22/2016 1751   LEUKOCYTESUR NEGATIVE 05/22/2016 1751   Sepsis Labs Invalid input(s): PROCALCITONIN,  WBC,  LACTICIDVEN Microbiology Recent Results (from the past 240 hour(s))  Blood culture (routine x 2)     Status: None (Preliminary result)   Collection Time: 05/22/16  5:15 PM  Result Value Ref Range Status   Specimen Description BLOOD LEFT ANTECUBITAL  Final   Special Requests IN PEDIATRIC BOTTLE 3CC  Final   Culture NO GROWTH 4 DAYS  Final   Report Status PENDING  Incomplete  Urine culture     Status: None   Collection Time: 05/22/16  5:51 PM  Result Value Ref Range Status   Specimen Description URINE, RANDOM  Final   Special Requests NONE  Final   Culture NO GROWTH  Final   Report Status 05/23/2016 FINAL  Final  Blood culture (routine x 2)     Status: None (Preliminary result)   Collection  Time: 05/22/16 10:25 PM  Result Value Ref Range Status   Specimen Description BLOOD LEFT WRIST  Final   Special Requests AEROBIC BOTTLE ONLY 5ML  Final   Culture NO GROWTH 3 DAYS  Final   Report Status PENDING  Incomplete  MRSA PCR Screening     Status: None   Collection Time: 05/23/16  6:20 AM  Result Value Ref Range Status   MRSA by PCR NEGATIVE NEGATIVE Final    Comment:        The GeneXpert MRSA Assay (FDA approved for NASAL specimens only), is one component of a comprehensive MRSA colonization surveillance program. It is not intended to diagnose MRSA infection nor to guide or monitor treatment for MRSA infections.   CSF culture     Status: None (Preliminary result)   Collection Time: 05/24/16  1:15 PM  Result Value Ref Range Status   Specimen Description CSF  Final   Special Requests TUBE 2  Final   Gram Stain   Final    WBC PRESENT, PREDOMINANTLY PMN NO ORGANISMS SEEN CYTOSPIN SMEAR    Culture NO GROWTH 2 DAYS  Final   Report Status PENDING  Incomplete     Time coordinating discharge: 45 minutes  SIGNED:  Noralee StainJennifer Jhanvi Drakeford, DO [facility] Pager [phone]  If 7PM-7AM, please contact night-coverage [email or web address] Password [email or web address] 05/26/2016, 1:56 PM

## 2016-05-27 LAB — CULTURE, BLOOD (ROUTINE X 2): CULTURE: NO GROWTH

## 2016-05-27 LAB — CSF CULTURE

## 2016-05-27 LAB — CSF CULTURE W GRAM STAIN: Culture: NO GROWTH

## 2016-05-28 LAB — CULTURE, BLOOD (ROUTINE X 2): CULTURE: NO GROWTH

## 2016-05-30 LAB — HEPATITIS C GENOTYPE

## 2016-05-30 LAB — HCV RNA QUANT RFLX ULTRA OR GENOTYP
HCV RNA Qnt(log copy/mL): 5.045 log10 IU/mL
HepC Qn: 111000 IU/mL

## 2016-11-19 DIAGNOSIS — R569 Unspecified convulsions: Secondary | ICD-10-CM | POA: Diagnosis not present

## 2016-11-19 DIAGNOSIS — J45909 Unspecified asthma, uncomplicated: Secondary | ICD-10-CM | POA: Diagnosis not present

## 2016-11-19 DIAGNOSIS — Z9119 Patient's noncompliance with other medical treatment and regimen: Secondary | ICD-10-CM | POA: Diagnosis not present

## 2016-11-19 DIAGNOSIS — R509 Fever, unspecified: Secondary | ICD-10-CM | POA: Diagnosis not present

## 2016-11-20 DIAGNOSIS — R509 Fever, unspecified: Secondary | ICD-10-CM | POA: Diagnosis not present

## 2016-11-20 DIAGNOSIS — Z9119 Patient's noncompliance with other medical treatment and regimen: Secondary | ICD-10-CM | POA: Diagnosis not present

## 2016-11-20 DIAGNOSIS — R569 Unspecified convulsions: Secondary | ICD-10-CM | POA: Diagnosis not present

## 2016-11-20 DIAGNOSIS — J45909 Unspecified asthma, uncomplicated: Secondary | ICD-10-CM | POA: Diagnosis not present

## 2017-03-17 DIAGNOSIS — R569 Unspecified convulsions: Secondary | ICD-10-CM | POA: Diagnosis not present

## 2017-03-17 DIAGNOSIS — E872 Acidosis: Secondary | ICD-10-CM | POA: Diagnosis not present

## 2017-03-17 DIAGNOSIS — J45909 Unspecified asthma, uncomplicated: Secondary | ICD-10-CM | POA: Diagnosis not present

## 2017-03-17 DIAGNOSIS — F17210 Nicotine dependence, cigarettes, uncomplicated: Secondary | ICD-10-CM | POA: Diagnosis not present

## 2017-03-17 DIAGNOSIS — G40409 Other generalized epilepsy and epileptic syndromes, not intractable, without status epilepticus: Secondary | ICD-10-CM | POA: Diagnosis not present

## 2017-03-18 DIAGNOSIS — F17210 Nicotine dependence, cigarettes, uncomplicated: Secondary | ICD-10-CM | POA: Diagnosis not present

## 2017-03-18 DIAGNOSIS — R569 Unspecified convulsions: Secondary | ICD-10-CM | POA: Diagnosis not present

## 2017-03-18 DIAGNOSIS — J45909 Unspecified asthma, uncomplicated: Secondary | ICD-10-CM | POA: Diagnosis not present

## 2017-03-18 DIAGNOSIS — G40409 Other generalized epilepsy and epileptic syndromes, not intractable, without status epilepticus: Secondary | ICD-10-CM | POA: Diagnosis not present

## 2017-12-05 IMAGING — US US ABDOMEN LIMITED
1 series · 14 of 25 positions shown · non-contrast
Comparison: 12/02/2012 CT abdomen/ pelvis.

CLINICAL DATA: Inpatient.  Transaminitis.  Encephalopathy.

EXAM:
US ABDOMEN LIMITED - RIGHT UPPER QUADRANT

[Series 1: us abdomen limited · 0.22mm/px · 14 of 52 slices shown]
[im 1/52]
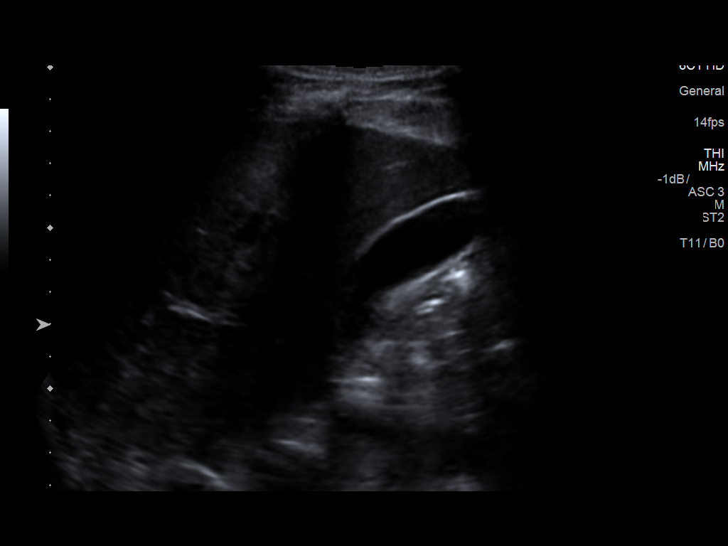
[im 5/52]
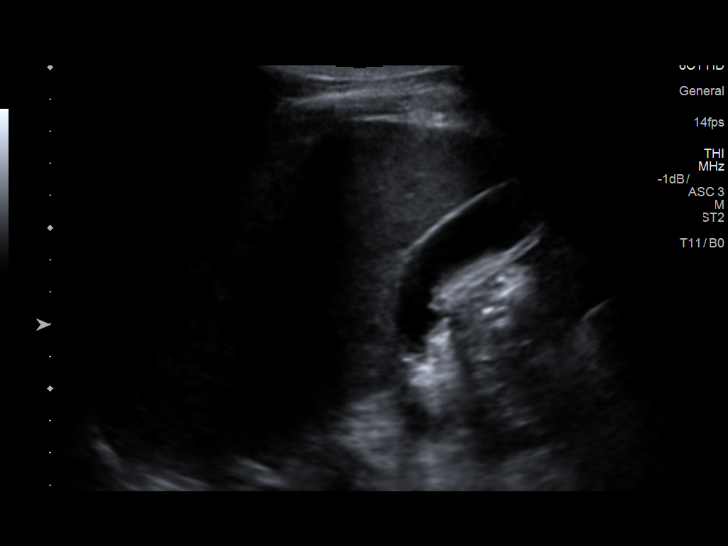
[im 9/52]
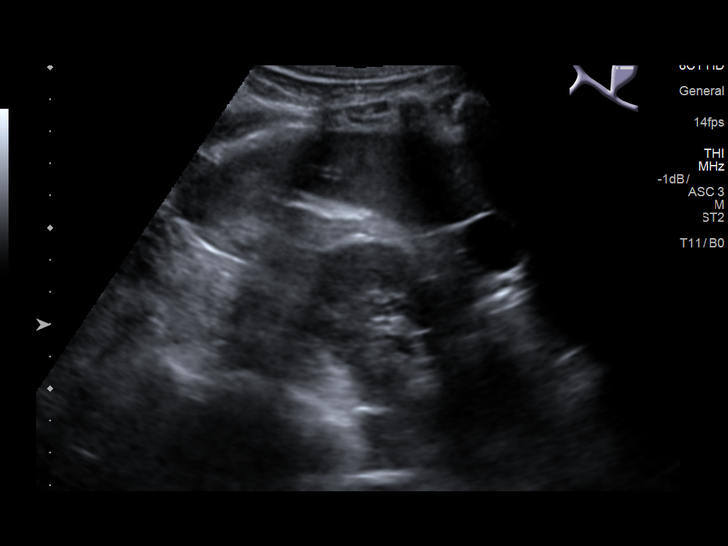
[im 13/52]
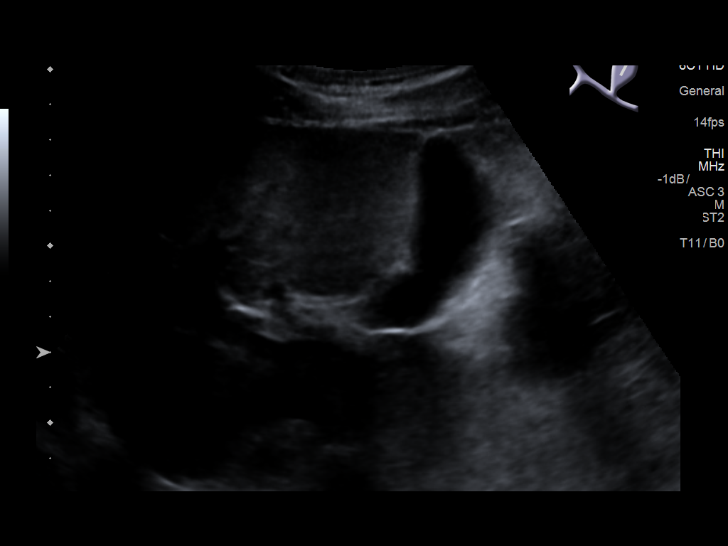
[im 18/52]
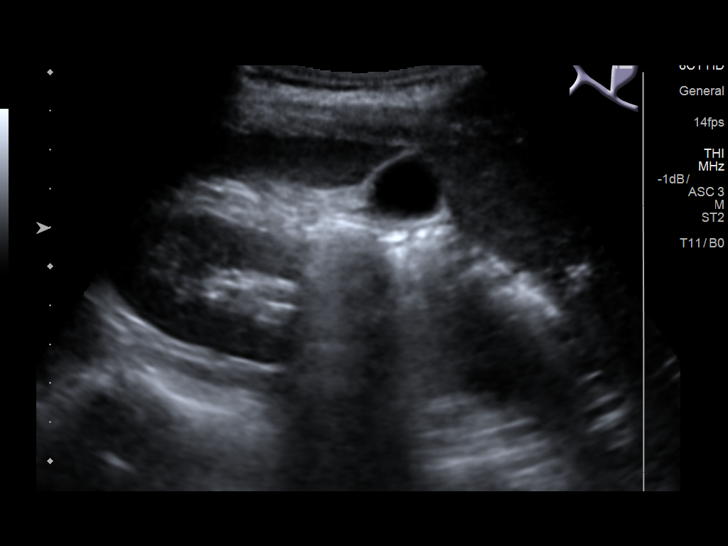
[im 20/52]
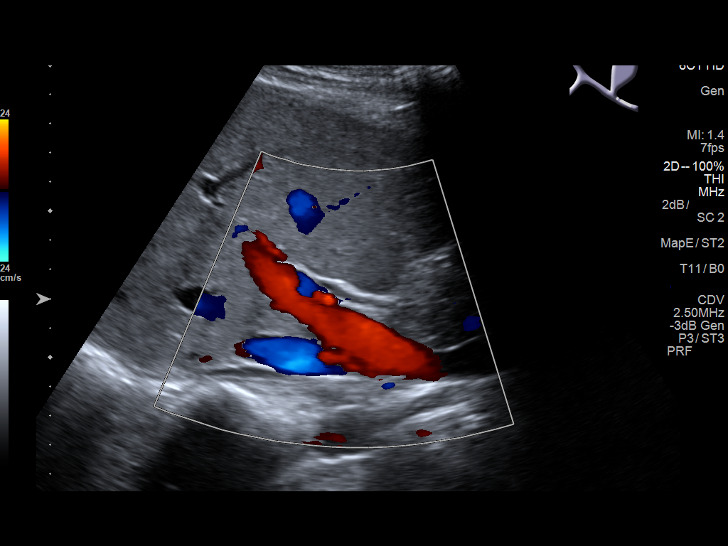
[im 24/52]
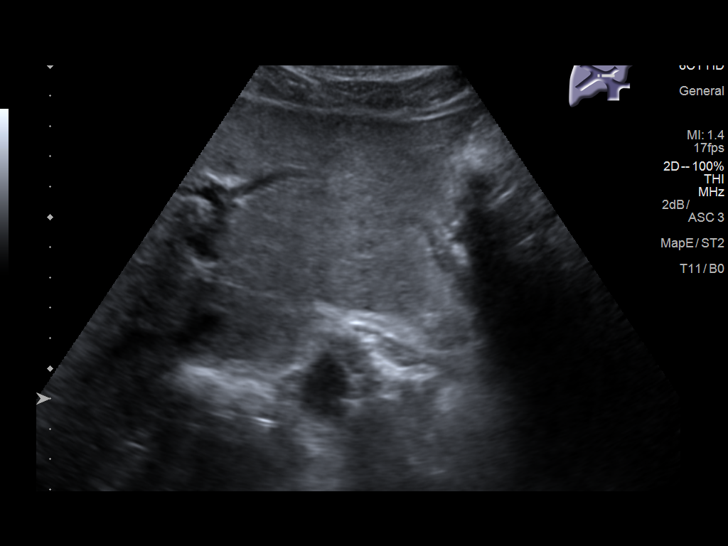
[im 28/52]
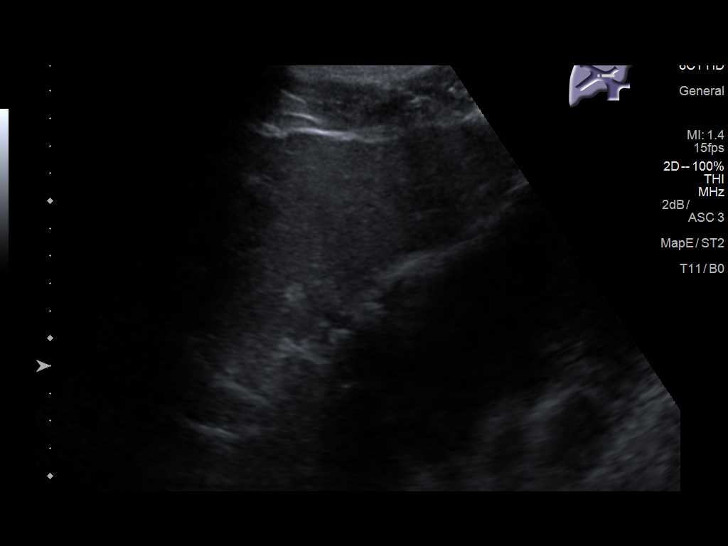
[im 32/52]
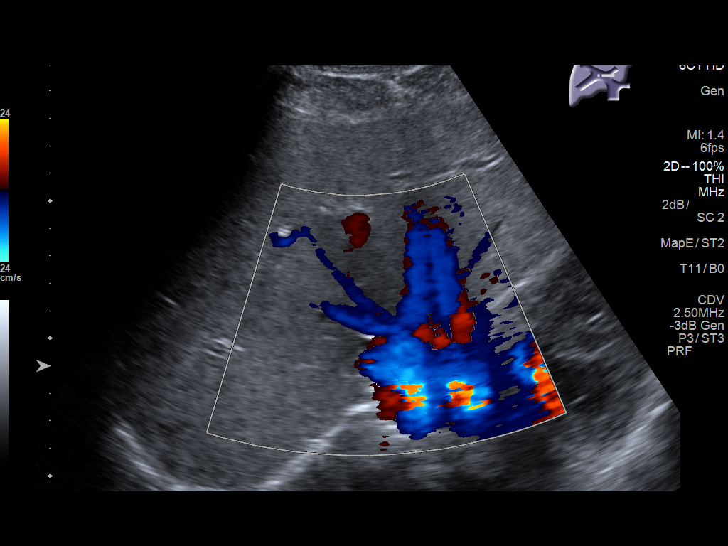
[im 35/52]
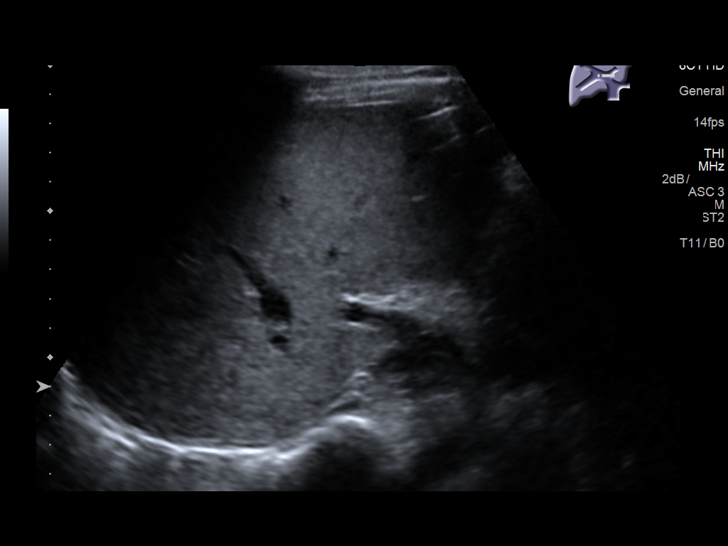
[im 39/52]
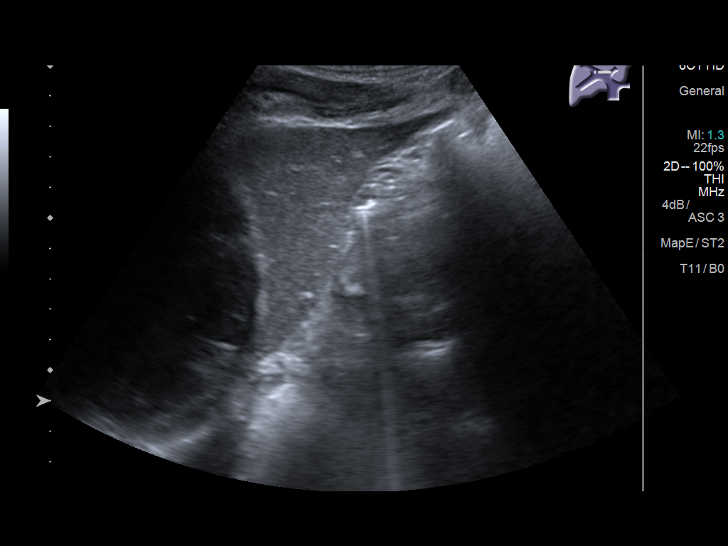
[im 43/52]
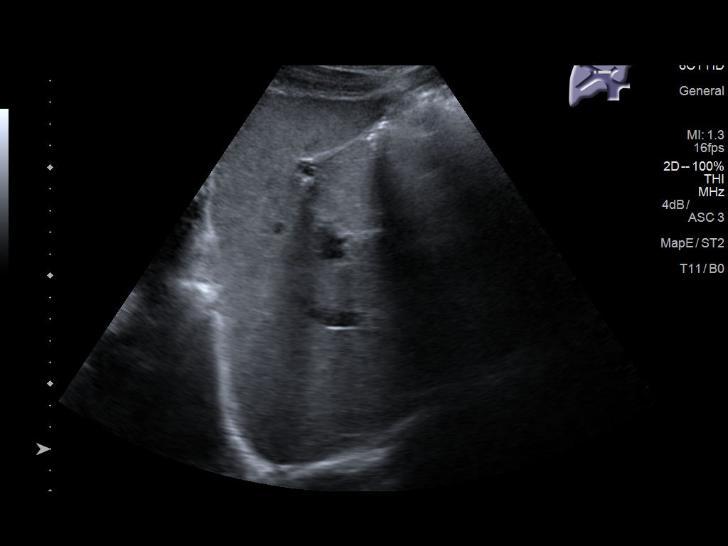
[im 47/52]
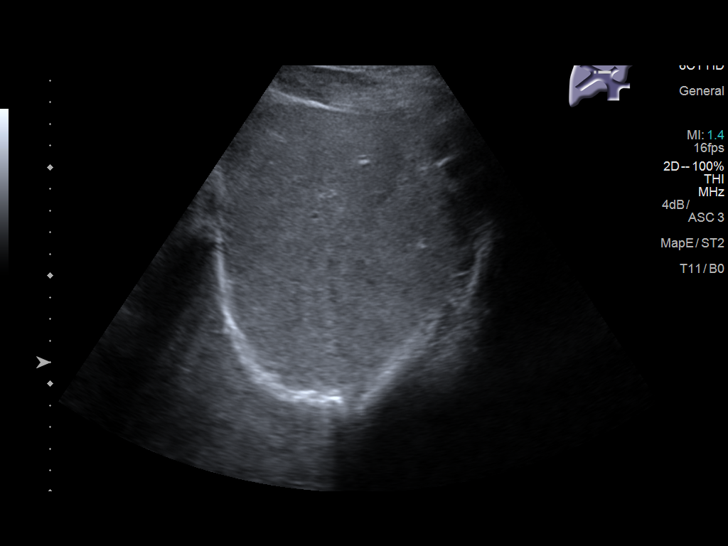
[im 52/52]
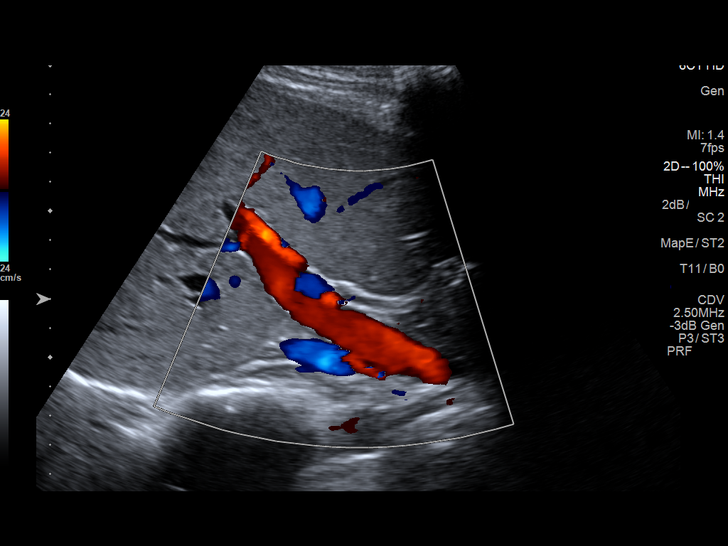

[14 of 25 positions shown; findings below may reference images not displayed]

FINDINGS: Gallbladder:

No gallstones or wall thickening visualized. No sonographic Murphy
sign noted by sonographer.

Common bile duct:

Diameter: 3 mm

Liver:

Liver parenchymal echogenicity and echotexture appear normal.
Questionable hyperechoic 1.0 x 0.7 x 0.8 cm superior liver lesion.
No additional liver lesions.
IMPRESSION: 1. Normal gallbladder, with no cholelithiasis. No biliary ductal
dilatation.
2. Questionable solitary small hyperechoic superior liver lesion,
which is indeterminate and statistically most likely to represent a
small hemangioma. Otherwise normal liver. Unless the patient has
risk factors for liver malignancy, follow-up outpatient MRI abdomen
without and with IV contrast can be performed in 3-6 months for
further characterization.

## 2018-03-06 ENCOUNTER — Encounter (HOSPITAL_COMMUNITY): Payer: Self-pay | Admitting: Emergency Medicine

## 2018-03-06 ENCOUNTER — Emergency Department (HOSPITAL_COMMUNITY)
Admission: EM | Admit: 2018-03-06 | Discharge: 2018-03-06 | Disposition: A | Discharge status: Home / Self Care | Payer: Medicaid Other | Attending: Emergency Medicine | Admitting: Emergency Medicine

## 2018-03-06 ENCOUNTER — Other Ambulatory Visit: Payer: Self-pay

## 2018-03-06 DIAGNOSIS — F17210 Nicotine dependence, cigarettes, uncomplicated: Secondary | ICD-10-CM | POA: Insufficient documentation

## 2018-03-06 DIAGNOSIS — Z79899 Other long term (current) drug therapy: Secondary | ICD-10-CM | POA: Insufficient documentation

## 2018-03-06 DIAGNOSIS — J45909 Unspecified asthma, uncomplicated: Secondary | ICD-10-CM | POA: Insufficient documentation

## 2018-03-06 DIAGNOSIS — R569 Unspecified convulsions: Secondary | ICD-10-CM | POA: Insufficient documentation

## 2018-03-06 LAB — CBC WITH DIFFERENTIAL/PLATELET
ABS IMMATURE GRANULOCYTES: 0.04 10*3/uL (ref 0.00–0.07)
BASOS ABS: 0.1 10*3/uL (ref 0.0–0.1)
Basophils Relative: 1 %
EOS PCT: 1 %
Eosinophils Absolute: 0.1 10*3/uL (ref 0.0–0.5)
HEMATOCRIT: 38.9 % — AB (ref 39.0–52.0)
HEMOGLOBIN: 12.8 g/dL — AB (ref 13.0–17.0)
Immature Granulocytes: 1 %
LYMPHS ABS: 1.6 10*3/uL (ref 0.7–4.0)
Lymphocytes Relative: 30 %
MCH: 29.5 pg (ref 26.0–34.0)
MCHC: 32.9 g/dL (ref 30.0–36.0)
MCV: 89.6 fL (ref 80.0–100.0)
MONO ABS: 0.5 10*3/uL (ref 0.1–1.0)
Monocytes Relative: 8 %
NEUTROS ABS: 3.3 10*3/uL (ref 1.7–7.7)
NRBC: 0 % (ref 0.0–0.2)
Neutrophils Relative %: 59 %
Platelets: ADEQUATE 10*3/uL (ref 150–400)
RBC: 4.34 MIL/uL (ref 4.22–5.81)
RDW: 13.8 % (ref 11.5–15.5)
WBC: 5.5 10*3/uL (ref 4.0–10.5)

## 2018-03-06 LAB — BASIC METABOLIC PANEL
Anion gap: 11 (ref 5–15)
BUN: 8 mg/dL (ref 6–20)
CO2: 24 mmol/L (ref 22–32)
CREATININE: 0.92 mg/dL (ref 0.61–1.24)
Calcium: 9 mg/dL (ref 8.9–10.3)
Chloride: 102 mmol/L (ref 98–111)
GFR calc Af Amer: >60 mL/min (ref >60)
GFR calc non Af Amer: >60 mL/min (ref >60)
Glucose, Bld: 103 mg/dL — ABNORMAL HIGH (ref 70–99)
POTASSIUM: 4.5 mmol/L (ref 3.5–5.1)
Sodium: 137 mmol/L (ref 135–145)

## 2018-03-06 LAB — MAGNESIUM: MAGNESIUM: 1.6 mg/dL — AB (ref 1.7–2.4)

## 2018-03-06 LAB — ETHANOL: Alcohol, Ethyl (B): <10 mg/dL (ref <10)

## 2018-03-06 MED ORDER — LORAZEPAM 2 MG/ML IJ SOLN
1.0000 mg | Freq: Once | INTRAMUSCULAR | Status: AC
  Administered 2018-03-06: 1 mg via INTRAMUSCULAR

## 2018-03-06 MED ORDER — LEVETIRACETAM 500 MG PO TABS: 500.0000 mg | ORAL_TABLET | Freq: Two times a day (BID) | ORAL | 0 refills | Status: AC

## 2018-03-06 MED ORDER — LORAZEPAM 2 MG/ML IJ SOLN
1.0000 mg | Freq: Once | INTRAMUSCULAR | Status: DC
  Filled 2018-03-06: qty 1

## 2018-03-06 MED ORDER — LEVETIRACETAM IN NACL 1500 MG/100ML IV SOLN
1500.0000 mg | Freq: Once | INTRAVENOUS | Status: AC
  Administered 2018-03-06: 1500 mg via INTRAVENOUS
  Filled 2018-03-06: qty 100

## 2018-03-06 MED ORDER — MAGNESIUM SULFATE 2 GM/50ML IV SOLN
2.0000 g | Freq: Once | INTRAVENOUS | Status: AC
  Administered 2018-03-06: 2 g via INTRAVENOUS
  Filled 2018-03-06: qty 50

## 2018-03-06 NOTE — ED Triage Notes (Signed)
Pt arrives to ED from home with complaints of seizures x2 this morning. EMS reports grand mal seizures. Pt has hx of seizures but is no taking his keppra due to no being able to afford his medicine Pt is currently post ictal and confused. Pt placed in position of comfort with bed locked and lowered, call bell in reach.
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
## 2018-03-06 NOTE — ED Notes (Signed)
Patient verbalizes understanding of discharge instructions. Opportunity for questioning and answers were provided. Armband removed by staff, pt discharged from ED.  

## 2018-03-06 NOTE — Care Management (Signed)
ED CM received consult concerning medication assistance CM reviewed patient's record, no health insurance listed,confirmed information with patient Pt is eligible for MATCH. Discussed MATCH program and the guidelines including the  $3 co-pay per prescription. Pt verbalized understanding and is agreeable with accepting the assistance.  Pt enrolled and MATCH letter printed and given to patient with list of participating pharmacies.  Patient was instructed to present  prescriptions with MATCH letter to a participating Pharmacies from the list provided, Patient verbalizes  understanding and appreciation for the assistance.  Discussed the importance of follow up with a PCP, patient agreeable, appointment [facility] 03/18/2018 at 9am patient verbalized understanding teach back done. No further ED CM needs identified.

## 2018-03-06 NOTE — Progress Notes (Signed)
CSW received call from Secretary informing CSW that MD is requesting that home health services and follow up appointments be set up for pt. CSW provided Diplomatic Services operational officer with contact information for [facility] ED RNCM covering.    No further CSW needs. CSW will sign off at this time.      Claude Manges Nassim Cosma, MSW, LCSW-A Emergency Department Clinical Social Worker [phone]

## 2018-03-18 ENCOUNTER — Ambulatory Visit: Payer: Medicaid Other | Admitting: Family Medicine

## 2019-04-09 DIAGNOSIS — R4182 Altered mental status, unspecified: Secondary | ICD-10-CM

## 2019-04-09 DIAGNOSIS — R509 Fever, unspecified: Secondary | ICD-10-CM

## 2019-04-09 DIAGNOSIS — J45909 Unspecified asthma, uncomplicated: Secondary | ICD-10-CM

## 2019-04-09 DIAGNOSIS — G40909 Epilepsy, unspecified, not intractable, without status epilepticus: Secondary | ICD-10-CM

## 2019-04-09 DIAGNOSIS — F1910 Other psychoactive substance abuse, uncomplicated: Secondary | ICD-10-CM

## 2019-05-31 DIAGNOSIS — J45909 Unspecified asthma, uncomplicated: Secondary | ICD-10-CM

## 2019-05-31 DIAGNOSIS — R4182 Altered mental status, unspecified: Secondary | ICD-10-CM

## 2019-05-31 DIAGNOSIS — G40909 Epilepsy, unspecified, not intractable, without status epilepticus: Secondary | ICD-10-CM

## 2019-05-31 DIAGNOSIS — F1510 Other stimulant abuse, uncomplicated: Secondary | ICD-10-CM

## 2019-05-31 DIAGNOSIS — G9340 Encephalopathy, unspecified: Secondary | ICD-10-CM

## 2019-12-05 ENCOUNTER — Emergency Department (HOSPITAL_COMMUNITY): Payer: Medicaid Other

## 2019-12-05 ENCOUNTER — Other Ambulatory Visit: Payer: Self-pay

## 2019-12-05 ENCOUNTER — Emergency Department (HOSPITAL_COMMUNITY)
Admission: EM | Admit: 2019-12-05 | Discharge: 2019-12-06 | Disposition: A | Discharge status: Home / Self Care | Payer: Medicaid Other | Attending: Emergency Medicine | Admitting: Emergency Medicine

## 2019-12-05 ENCOUNTER — Encounter (HOSPITAL_COMMUNITY): Payer: Self-pay | Admitting: Emergency Medicine

## 2019-12-05 DIAGNOSIS — F17210 Nicotine dependence, cigarettes, uncomplicated: Secondary | ICD-10-CM | POA: Insufficient documentation

## 2019-12-05 DIAGNOSIS — R509 Fever, unspecified: Secondary | ICD-10-CM

## 2019-12-05 DIAGNOSIS — Z20822 Contact with and (suspected) exposure to covid-19: Secondary | ICD-10-CM | POA: Insufficient documentation

## 2019-12-05 DIAGNOSIS — J45909 Unspecified asthma, uncomplicated: Secondary | ICD-10-CM | POA: Insufficient documentation

## 2019-12-05 DIAGNOSIS — H21561 Pupillary abnormality, right eye: Secondary | ICD-10-CM | POA: Insufficient documentation

## 2019-12-05 DIAGNOSIS — R569 Unspecified convulsions: Secondary | ICD-10-CM

## 2019-12-05 DIAGNOSIS — H21562 Pupillary abnormality, left eye: Secondary | ICD-10-CM

## 2019-12-05 LAB — CBC WITH DIFFERENTIAL/PLATELET
Abs Immature Granulocytes: 0.02 10*3/uL (ref 0.00–0.07)
Basophils Absolute: 0 10*3/uL (ref 0.0–0.1)
Basophils Relative: 0 %
Eosinophils Absolute: 0.1 10*3/uL (ref 0.0–0.5)
Eosinophils Relative: 1 %
HCT: 40.4 % (ref 39.0–52.0)
Hemoglobin: 12.7 g/dL — ABNORMAL LOW (ref 13.0–17.0)
Immature Granulocytes: 0 %
Lymphocytes Relative: 14 %
Lymphs Abs: 1 10*3/uL (ref 0.7–4.0)
MCH: 27.9 pg (ref 26.0–34.0)
MCHC: 31.4 g/dL (ref 30.0–36.0)
MCV: 88.6 fL (ref 80.0–100.0)
Monocytes Absolute: 0.3 10*3/uL (ref 0.1–1.0)
Monocytes Relative: 5 %
Neutro Abs: 5.7 10*3/uL (ref 1.7–7.7)
Neutrophils Relative %: 80 %
Platelets: UNDETERMINED 10*3/uL (ref 150–400)
RBC: 4.56 MIL/uL (ref 4.22–5.81)
RDW: 13.9 % (ref 11.5–15.5)
WBC: 7.2 10*3/uL (ref 4.0–10.5)
nRBC: 0 % (ref 0.0–0.2)

## 2019-12-05 LAB — COMPREHENSIVE METABOLIC PANEL
ALT: 21 U/L (ref 0–44)
AST: 30 U/L (ref 15–41)
Albumin: 4.2 g/dL (ref 3.5–5.0)
Alkaline Phosphatase: 52 U/L (ref 38–126)
Anion gap: 12 (ref 5–15)
BUN: 7 mg/dL (ref 6–20)
CO2: 22 mmol/L (ref 22–32)
Calcium: 9.1 mg/dL (ref 8.9–10.3)
Chloride: 98 mmol/L (ref 98–111)
Creatinine, Ser: 0.96 mg/dL (ref 0.61–1.24)
GFR calc Af Amer: >60 mL/min (ref >60)
GFR calc non Af Amer: >60 mL/min (ref >60)
Glucose, Bld: 111 mg/dL — ABNORMAL HIGH (ref 70–99)
Potassium: 3.8 mmol/L (ref 3.5–5.1)
Sodium: 132 mmol/L — ABNORMAL LOW (ref 135–145)
Total Bilirubin: 0.4 mg/dL (ref 0.3–1.2)
Total Protein: 7.9 g/dL (ref 6.5–8.1)

## 2019-12-05 LAB — MAGNESIUM: Magnesium: 2 mg/dL (ref 1.7–2.4)

## 2019-12-05 LAB — ETHANOL: Alcohol, Ethyl (B): <10 mg/dL (ref <10)

## 2019-12-05 MED ORDER — LEVETIRACETAM IN NACL 1000 MG/100ML IV SOLN
1000.0000 mg | Freq: Once | INTRAVENOUS | Status: AC
  Administered 2019-12-05: 1000 mg via INTRAVENOUS
  Filled 2019-12-05: qty 100

## 2019-12-05 MED ORDER — FLUORESCEIN SODIUM 1 MG OP STRP
1.0000 | ORAL_STRIP | Freq: Once | OPHTHALMIC | Status: AC
  Administered 2019-12-06: 1 via OPHTHALMIC
  Filled 2019-12-05: qty 1

## 2019-12-05 MED ORDER — TETRACAINE HCL 0.5 % OP SOLN
2.0000 [drp] | Freq: Once | OPHTHALMIC | Status: AC
  Administered 2019-12-06: 2 [drp] via OPHTHALMIC
  Filled 2019-12-05: qty 4
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
## 2019-12-05 NOTE — Discharge Instructions (Signed)
Please read and follow all provided instructions.  Your diagnoses today include:  1. Seizures (HCC)     Tests performed today include: Blood cell counts - were normal Electrolytes and kidney function - were normal  Vital signs. See below for your results today.   Medications prescribed:   None  Take any prescribed medications only as directed.  Home care instructions:  Follow any educational materials contained in this packet.  BE VERY CAREFUL not to take multiple medicines containing Tylenol (also called acetaminophen). Doing so can lead to an overdose which can damage your liver and cause liver failure and possibly death.   Follow-up instructions: Please follow-up with your primary care provider in the next 3 days for further evaluation of your symptoms.   Return instructions:   Please return to the Emergency Department if you experience worsening symptoms.   Return if you have another seizure.   Please return if you have any other emergent concerns.  Additional Information:  Your vital signs today were: BP 111/69    Pulse 76    Temp (!) 100.5 F (38.1 C) (Rectal)    Resp 17    Ht 5\' 10"  (1.778 m)    Wt 57.8 kg    SpO2 100%    BMI 18.28 kg/m  If your blood pressure (BP) was elevated above 135/85 this visit, please have this repeated by your doctor within one month. --------------

## 2019-12-05 NOTE — ED Triage Notes (Signed)
Pt brought to ED by [facility] from home for 4 episodes of seizures. Pt mostly unresponsive on arrival. 5 mg Versed given by EMS pta to Ed BP 110/80, HR 112, SPO2 100% RA.

## 2019-12-06 ENCOUNTER — Emergency Department (HOSPITAL_COMMUNITY): Payer: Medicaid Other

## 2019-12-06 LAB — URINALYSIS, ROUTINE W REFLEX MICROSCOPIC
Bilirubin Urine: NEGATIVE
Glucose, UA: NEGATIVE mg/dL
Hgb urine dipstick: NEGATIVE
Ketones, ur: 20 mg/dL — AB
Leukocytes,Ua: NEGATIVE
Nitrite: NEGATIVE
Protein, ur: NEGATIVE mg/dL
Specific Gravity, Urine: 1.012 (ref 1.005–1.030)
pH: 5 (ref 5.0–8.0)

## 2019-12-06 LAB — RESPIRATORY PANEL BY RT PCR (FLU A&B, COVID)
Influenza A by PCR: NEGATIVE
Influenza B by PCR: NEGATIVE
SARS Coronavirus 2 by RT PCR: NEGATIVE

## 2019-12-06 MED ORDER — SODIUM CHLORIDE 0.9 % IV SOLN
1000.0000 mL | INTRAVENOUS | Status: DC
  Administered 2019-12-06: 1000 mL via INTRAVENOUS

## 2019-12-06 MED ORDER — SODIUM CHLORIDE 0.9 % IV BOLUS (SEPSIS)
1000.0000 mL | Freq: Once | INTRAVENOUS | Status: AC
  Administered 2019-12-06: 1000 mL via INTRAVENOUS

## 2019-12-06 MED ORDER — FLUORESCEIN SODIUM 1 MG OP STRP
ORAL_STRIP | OPHTHALMIC | Status: AC
  Filled 2019-12-06: qty 1

## 2019-12-06 MED ORDER — ONDANSETRON 4 MG PO TBDP: 4.0000 mg | ORAL_TABLET | Freq: Three times a day (TID) | ORAL | 0 refills | Status: AC | PRN

## 2019-12-06 MED ORDER — ACETAMINOPHEN 500 MG PO TABS
1000.0000 mg | ORAL_TABLET | Freq: Once | ORAL | Status: AC
  Administered 2019-12-06: 1000 mg via ORAL
  Filled 2019-12-06: qty 2

## 2020-09-24 ENCOUNTER — Other Ambulatory Visit: Payer: Self-pay

## 2020-09-24 ENCOUNTER — Encounter (HOSPITAL_COMMUNITY): Payer: Self-pay | Admitting: Emergency Medicine

## 2020-09-24 ENCOUNTER — Emergency Department (HOSPITAL_COMMUNITY)
Admission: EM | Admit: 2020-09-24 | Discharge: 2020-09-24 | Disposition: A | Discharge status: Home / Self Care | Payer: Medicaid Other | Attending: Emergency Medicine | Admitting: Emergency Medicine

## 2020-09-24 DIAGNOSIS — F1010 Alcohol abuse, uncomplicated: Secondary | ICD-10-CM | POA: Insufficient documentation

## 2020-09-24 DIAGNOSIS — F17210 Nicotine dependence, cigarettes, uncomplicated: Secondary | ICD-10-CM | POA: Insufficient documentation

## 2020-09-24 DIAGNOSIS — T50901A Poisoning by unspecified drugs, medicaments and biological substances, accidental (unintentional), initial encounter: Secondary | ICD-10-CM

## 2020-09-24 DIAGNOSIS — R1110 Vomiting, unspecified: Secondary | ICD-10-CM | POA: Insufficient documentation

## 2020-09-24 DIAGNOSIS — R109 Unspecified abdominal pain: Secondary | ICD-10-CM | POA: Insufficient documentation

## 2020-09-24 DIAGNOSIS — F1510 Other stimulant abuse, uncomplicated: Secondary | ICD-10-CM | POA: Insufficient documentation

## 2020-09-24 DIAGNOSIS — Y900 Blood alcohol level of less than 20 mg/100 ml: Secondary | ICD-10-CM | POA: Insufficient documentation

## 2020-09-24 DIAGNOSIS — J45909 Unspecified asthma, uncomplicated: Secondary | ICD-10-CM | POA: Insufficient documentation

## 2020-09-24 DIAGNOSIS — T401X1A Poisoning by heroin, accidental (unintentional), initial encounter: Secondary | ICD-10-CM | POA: Insufficient documentation

## 2020-09-24 LAB — COMPREHENSIVE METABOLIC PANEL
ALT: 25 U/L (ref 0–44)
AST: 24 U/L (ref 15–41)
Albumin: 5.4 g/dL — ABNORMAL HIGH (ref 3.5–5.0)
Alkaline Phosphatase: 56 U/L (ref 38–126)
Anion gap: 13 (ref 5–15)
BUN: 15 mg/dL (ref 6–20)
CO2: 23 mmol/L (ref 22–32)
Calcium: 10.1 mg/dL (ref 8.9–10.3)
Chloride: 99 mmol/L (ref 98–111)
Creatinine, Ser: 0.83 mg/dL (ref 0.61–1.24)
GFR, Estimated: >60 mL/min (ref >60)
Glucose, Bld: 82 mg/dL (ref 70–99)
Potassium: 3.7 mmol/L (ref 3.5–5.1)
Sodium: 135 mmol/L (ref 135–145)
Total Bilirubin: 1 mg/dL (ref 0.3–1.2)
Total Protein: 9.4 g/dL — ABNORMAL HIGH (ref 6.5–8.1)

## 2020-09-24 LAB — LIPASE, BLOOD: Lipase: 27 U/L (ref 11–51)

## 2020-09-24 LAB — CBC WITH DIFFERENTIAL/PLATELET
Abs Immature Granulocytes: 0.02 10*3/uL (ref 0.00–0.07)
Basophils Absolute: 0 10*3/uL (ref 0.0–0.1)
Basophils Relative: 0 %
Eosinophils Absolute: 0 10*3/uL (ref 0.0–0.5)
Eosinophils Relative: 0 %
HCT: 38.9 % — ABNORMAL LOW (ref 39.0–52.0)
Hemoglobin: 13 g/dL (ref 13.0–17.0)
Immature Granulocytes: 0 %
Lymphocytes Relative: 35 %
Lymphs Abs: 3.4 10*3/uL (ref 0.7–4.0)
MCH: 28.7 pg (ref 26.0–34.0)
MCHC: 33.4 g/dL (ref 30.0–36.0)
MCV: 85.9 fL (ref 80.0–100.0)
Monocytes Absolute: 0.9 10*3/uL (ref 0.1–1.0)
Monocytes Relative: 9 %
Neutro Abs: 5.3 10*3/uL (ref 1.7–7.7)
Neutrophils Relative %: 56 %
Platelets: 261 10*3/uL (ref 150–400)
RBC: 4.53 MIL/uL (ref 4.22–5.81)
RDW: 13.2 % (ref 11.5–15.5)
WBC: 9.7 10*3/uL (ref 4.0–10.5)
nRBC: 0 % (ref 0.0–0.2)

## 2020-09-24 LAB — ETHANOL: Alcohol, Ethyl (B): <10 mg/dL (ref <10)
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
## 2020-09-24 NOTE — Discharge Instructions (Addendum)
You were evaluated today in the emergency room after accidental overdose.  Your evaluation today is reassuring and you do not require inpatient detox treatment.  Follow-up with an outpatient treatment center as we discussed

## 2020-09-24 NOTE — ED Provider Notes (Addendum)
Emergency Medicine Provider Triage Evaluation Note  Noah [name] , a 40 y.o. male  was evaluated in triage.  Pt presents to the emergency department status post unintentional heroin overdose that occurred shortly prior to arrival.  Patient states he snorted some heroin and accidentally overdosed, he received 2 mg of intranasal Narcan from EMS with improvement.  He states that he also used some alcohol and methamphetamines today.  He has been having abdominal pain for the past few days, generalized with associated nausea and approximately 2 episodes of emesis per day.  Review of Systems  Positive: Nausea, vomiting, abdominal pain Negative: Diarrhea, melena, hematochezia, dysuria, fever, SI  Physical Exam  BP 115/75 (BP Location: Left Arm)   Pulse (!) 115   Temp 98.7 F (37.1 C) (Oral)   Resp 18   Ht 5\' 10"  (1.778 m)   Wt 60.8 kg   SpO2 100%   BMI 19.23 kg/m   Gen:   Awake, no distress   Resp:  Normal effort  MSK:   Moves extremities without difficulty  Other:  Abdomen without peritoneal signs.  Medical Decision Making  Medically screening exam initiated at 3:17 PM.  Appropriate orders placed.  Noah [name] was informed that the remainder of the evaluation will be completed by another provider, this initial triage assessment does not replace that evaluation, and the importance of remaining in the ED until their evaluation is complete.  Unintentional overdose.    Arvilla Market, PA-C 09/24/20 1518    [address] PA-C 09/24/20 1622    09/26/20, MD 09/24/20 1622

## 2020-09-24 NOTE — ED Notes (Signed)
Called Lab to get blood.   Tried twice and veins blew.  45 mis to 1hr.

## 2020-09-24 NOTE — ED Triage Notes (Signed)
Arrives via EMS from home, C/C overdose from heroin, 2mg  Narcan given intranasally. Patient is alert and oriented, complains of abdominal pain now.

## 2021-07-17 IMAGING — CT CT HEAD W/O CM
3 series · 14 of 47 positions shown, 16 images · non-contrast
Comparison: CT head 10/17/2019

CLINICAL DATA: Seizure x 4.

EXAM:
CT HEAD WITHOUT CONTRAST
TECHNIQUE: Contiguous axial images were obtained from the base of the skull
through the vertex without intravenous contrast.

[Series 3: head 5.0 h30s · axial · 0.48mm/px · z∈[-141,-16]mm · 8 of 31 slices shown, 10 images]
[im 3/31  brain]
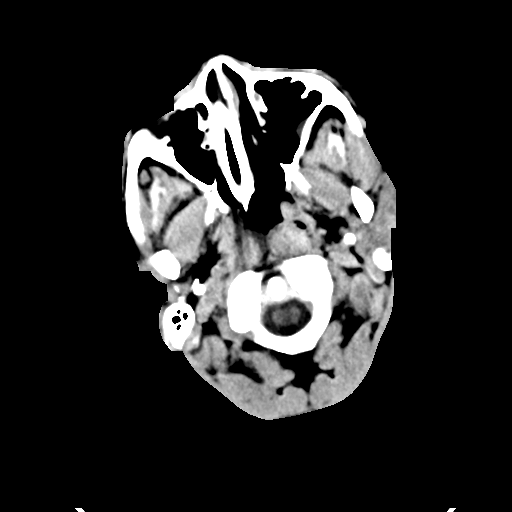
[im 3/31  bone]
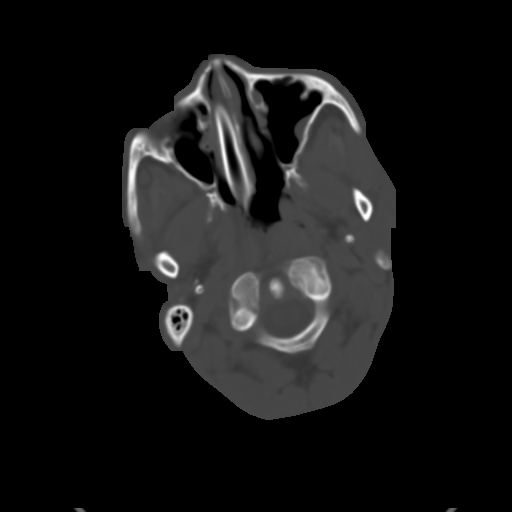
[im 7/31  brain]
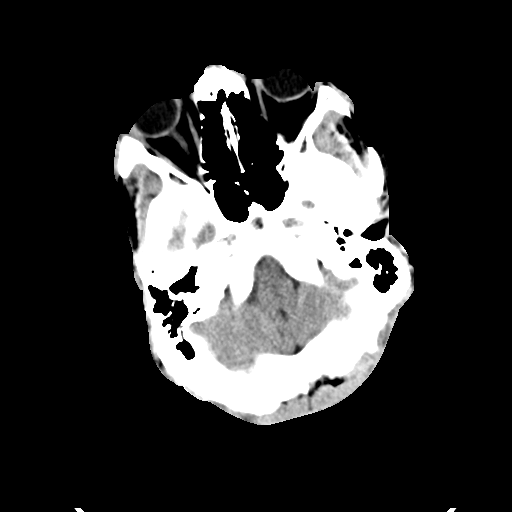
[im 10/31  brain]
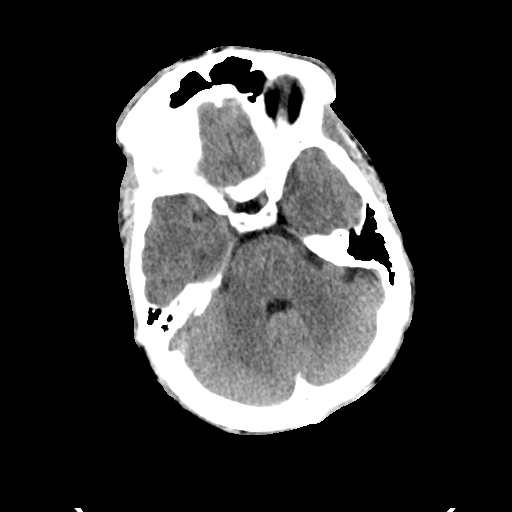
[im 14/31  brain]
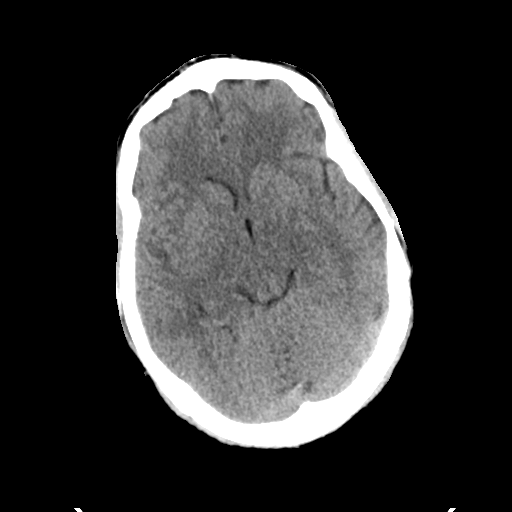
[im 17/31  brain]
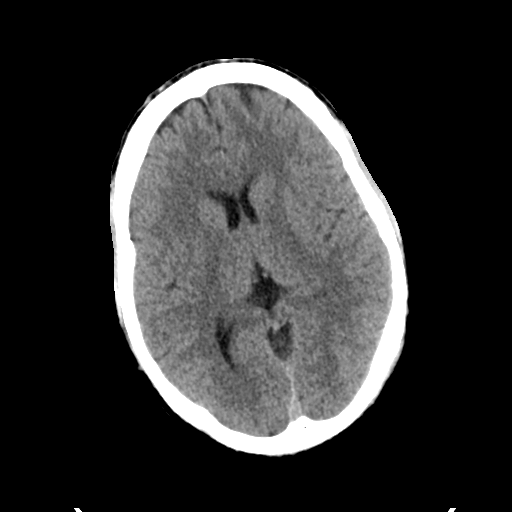
[im 17/31  bone]
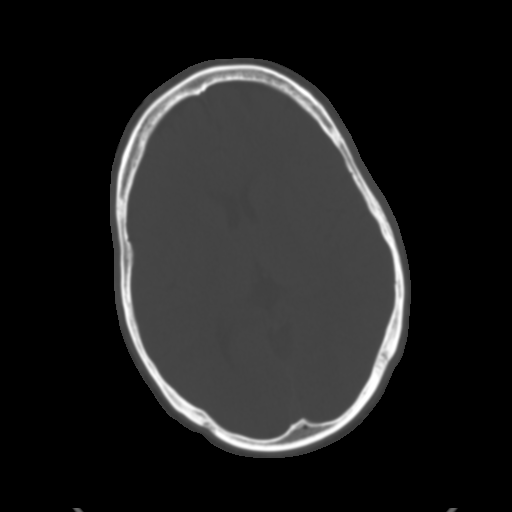
[im 21/31  brain]
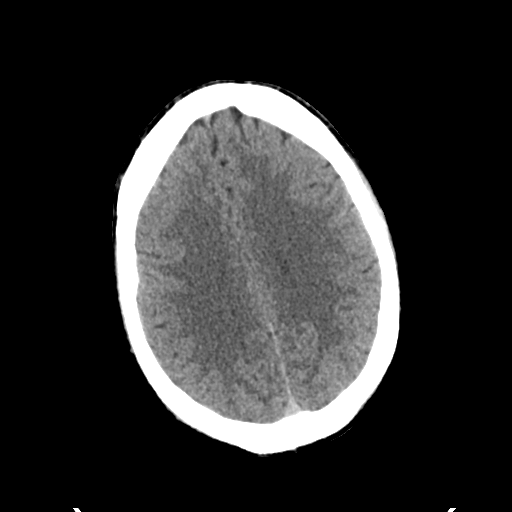
[im 24/31  brain]
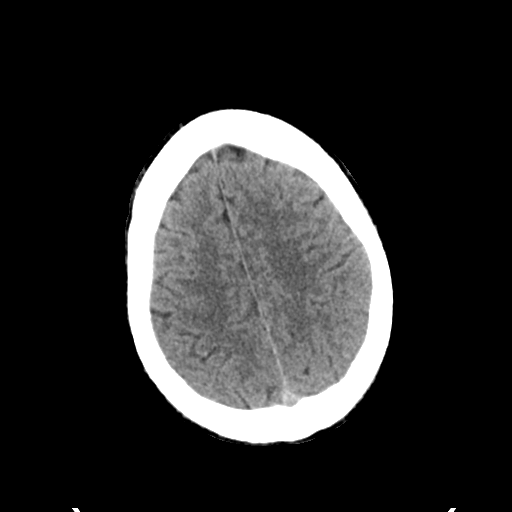
[im 28/31  brain]
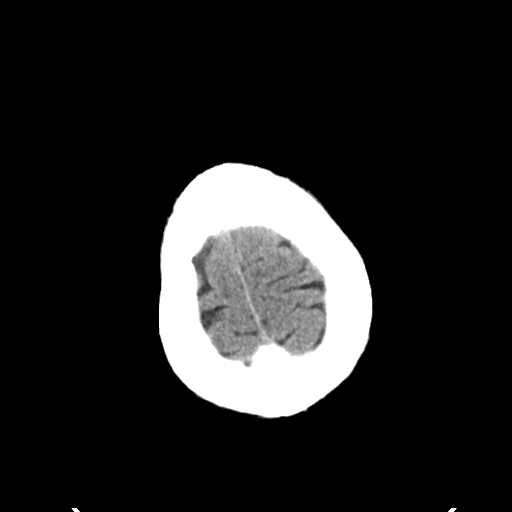

[Series 5: head 3.0 mpr cor · coronal · 0.30mm/px · 3 of 74 slices shown]
[im 25/74  brain]
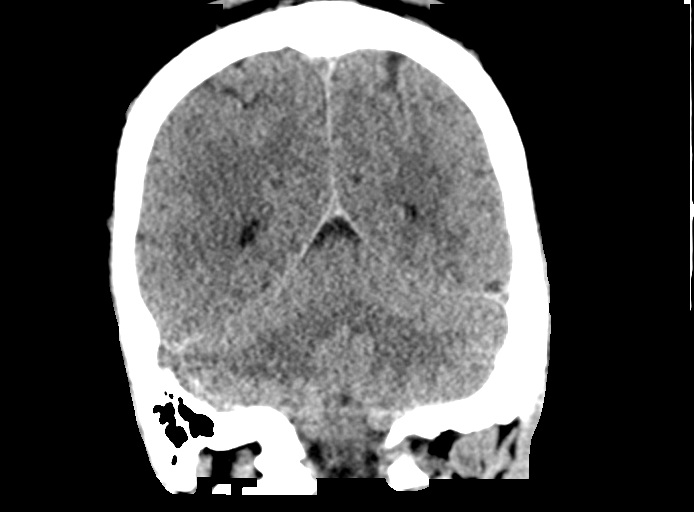
[im 33/74  brain]
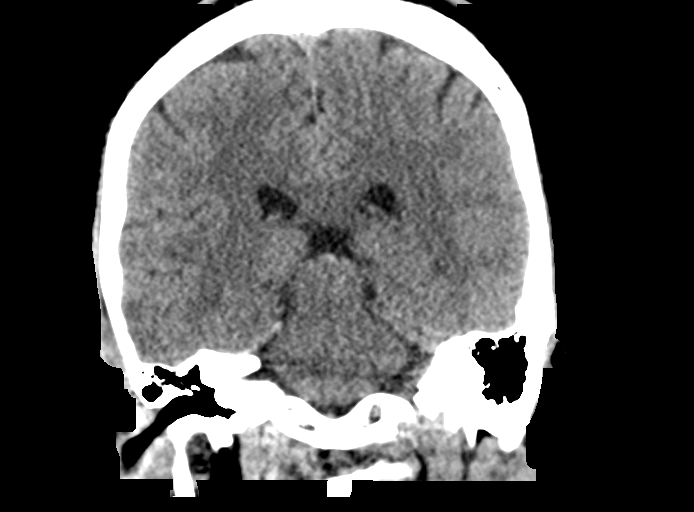
[im 41/74  brain]
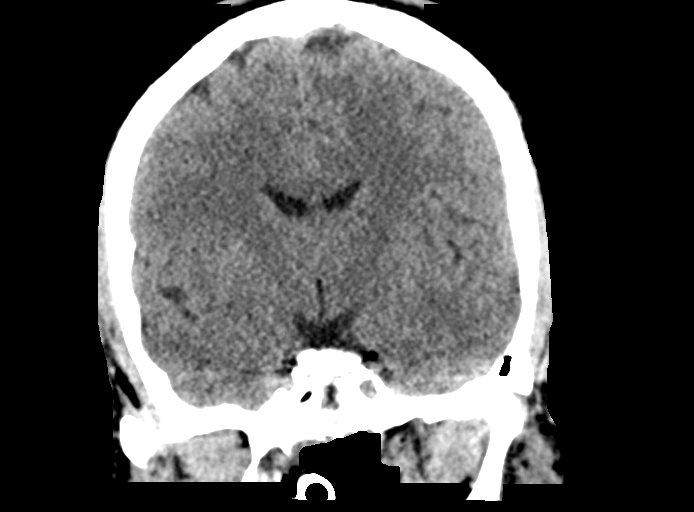

[Series 6: head 3.0 mpr sag · sagittal · 0.30mm/px · 3 of 67 slices shown]
[im 23/67  brain]
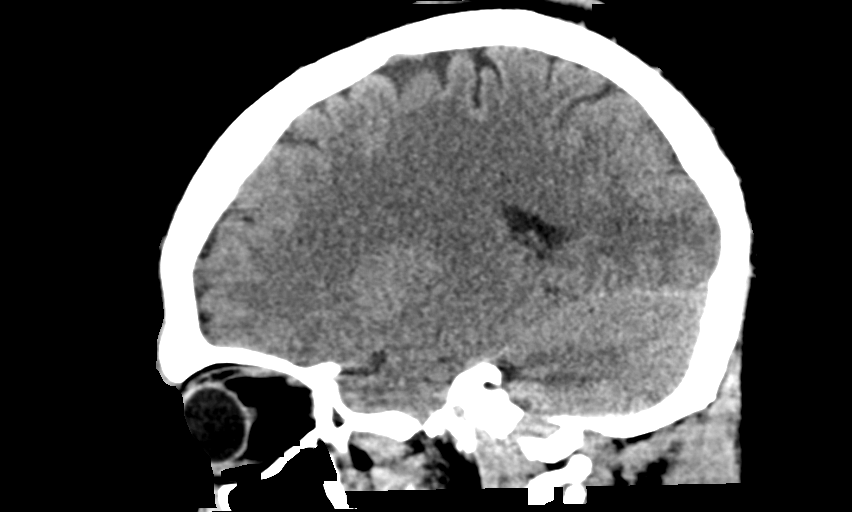
[im 34/67  brain]
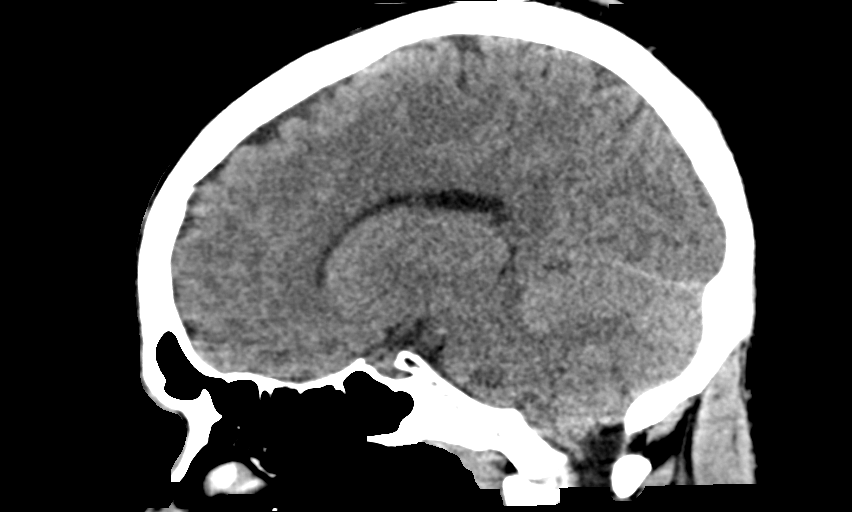
[im 45/67  brain]
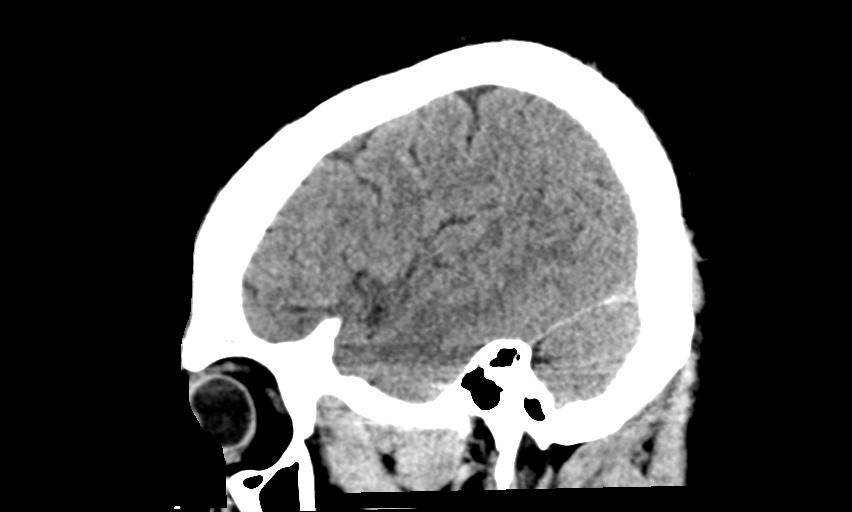

[14 of 47 positions shown; findings below may reference images not displayed]

FINDINGS: Brain: No evidence of acute infarction, hemorrhage, hydrocephalus,
extra-axial collection or mass lesion/mass effect.

Vascular: No hyperdense vessel or unexpected calcification.

Skull: Normal. Negative for fracture or focal lesion.

Sinuses/Orbits: Mucosal thickening in the bilateral maxillary
sinuses. Orbits are unremarkable.

Other: None.
IMPRESSION: No acute intracranial findings.
